# Patient Record
Sex: Female | Born: 1995 | Race: White | Hispanic: No | Marital: Single | Smoking: Never smoker
Health system: Southern US, Community
[De-identification: ages and names within clinical notes are randomized; demographics above are authoritative.]

## PROBLEM LIST (undated history)

## (undated) DIAGNOSIS — J45909 Unspecified asthma, uncomplicated: Secondary | ICD-10-CM

## (undated) DIAGNOSIS — C8190 Hodgkin lymphoma, unspecified, unspecified site: Secondary | ICD-10-CM

---
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
## 2019-10-29 ENCOUNTER — Telehealth: Payer: Self-pay | Admitting: Hematology

## 2019-10-29 NOTE — Telephone Encounter (Signed)
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 Ms. Pitstick has been scheduled to see Dr. Irene Limbo on 8/24 at 11am. Aware to arrive 15 minutes early. Pt's mom will also fax additional records to our office.

## 2019-11-09 ENCOUNTER — Ambulatory Visit: Payer: Self-pay | Admitting: Hematology

## 2019-11-10 ENCOUNTER — Inpatient Hospital Stay: Payer: 59 | Attending: Hematology | Admitting: Hematology

## 2019-11-10 ENCOUNTER — Telehealth: Payer: Self-pay | Admitting: *Deleted

## 2019-11-10 ENCOUNTER — Other Ambulatory Visit: Payer: Self-pay

## 2019-11-10 VITALS — BP 122/80 | HR 76 | Temp 98.6°F | Resp 18 | Ht 74.0 in | Wt 150.3 lb

## 2019-11-10 DIAGNOSIS — D638 Anemia in other chronic diseases classified elsewhere: Secondary | ICD-10-CM | POA: Diagnosis not present

## 2019-11-10 DIAGNOSIS — C8118 Nodular sclerosis classical Hodgkin lymphoma, lymph nodes of multiple sites: Secondary | ICD-10-CM | POA: Diagnosis not present

## 2019-11-10 NOTE — Progress Notes (Signed)
HEMATOLOGY/ONCOLOGY CONSULTATION NOTE  Date of Service: 11/10/2019  Patient Care Team: System, Pcp Not In as PCP - General  CHIEF COMPLAINTS/PURPOSE OF CONSULTATION:  Nodular sclerosis classical Hodgkin lymphoma  HISTORY OF PRESENTING ILLNESS:   Sonia Doyle is a wonderful 24 y.o. female who has been referred to [place] by Dr. Fonda Kinder for evaluation and management of newly diagnosed nodular sclerosis CHL. Pt is accompanied today by her mother. The pt reports that she is doing well overall.   The pt reports that she has asthma and has had a wisdom tooth extraction and tonsillectomy. She is only using her inhaler as needed at this time. She has no other known medical issues or medication allergies. 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 She has a family history significant for skin cancers.   Pt noticed an enlarged right-sided supraclavicular lymph node, which lead to the work up. She has since noticed more enlarged lymph nodes, but also a reduction in the size of the lymph node that initially concerned her. She had a fever in June that was accompanied by chills and night sweats. Pt denies weight loss, but has had multiple itchy rashes.   Pt has not had a port placed yet, as she had to cancel her previous appointment. Pt is scheduled for a consultation at [facility] later today. She has been set up for ovum harvesting in about 7-10 days for fertility preservation. She is currently on Menopur.   Prior to her referral pt received one IV Iron test dose, which caused her to be hoarse. They did not attempt any further IV Iron infusions. She was told to continue PO Iron.   08/24/2019 US Soft Tissue revealed "Biliteral supraclavicular nodules with atypical featues for lymph nodes, consider further evaluation with enhanced CT of the chest, abdomen and pelvis for the presence of additional adenopathy".   09/03/2019 CT Chest  revealed "Anterior mediastinal, bilateral hilar and neck adenopathy most suggestive of Lymphoma".   10/09/2019 Right Cervical Lymph Node Biopsy revealed "Classic Hodgkin's lymphoma, nodular sclerosis type".   10/09/2019 Flow Cytometry Report revealed "Negative for B-cell lymphoproliferative disorder or aberrant T-cell immunophenotype"  10/27/2019 ECHO was normal  11/03/2019 Left Iliac Bone BM Bx revealed  "-Normocellular to slightly hypercellular marrow with trilineage hematopoiesis and adequate magakaryocytes - Negative for lymphoma or other form of malignancy".  11/05/2019 PET/CT revealed  "Hypermetabolic lymphadeopathy involving multiple nodal stations in the neck and chest, consistent with reported history of lymphoma."   Most recent lab results (10/20/2019) of CBC w/diff & CMP is as follows: all values are WNL except for WBC at 14.8K, PLT at 588K, MPV at 6.8, Neutro Abs at 12.08K, Glucose at 68, Albumin at 3.0, ALP at 147. 10/20/2019 LDH at 279 09/15/2019 Sed Rate at 73  On review of systems, pt reports fevers, chills, night sweats, rash, new lumps/bumps and denies unexpected weight loss, constipation, abdominal pain and any other symptoms.   On PMHx the pt reports Asthma, Tonsillectomy, Wisdom Tooth Extraction. On Social Hx the pt reports that she is a non-smoker and does not drink much alcohol outside of social situations.  On Family Hx the pt reports skin cancer.   MEDICAL HISTORY:  Childhood Asthma - not requiring preventative asthma medications/inhalers at present time   SURGICAL HISTORY: Tonsillectomy (2003) Wisdom Tooth Extraction    SOCIAL HISTORY: Social History   Socioeconomic History  . Marital status: Single  Spouse name: Not on file  . Number of children: Not on file  . Years of education: Not on file  . Highest education level: Not on file  Occupational History  . Not on file  Tobacco Use  . Smoking status: Not on file  Substance and Sexual Activity    . Alcohol use: Not on file  . Drug use: Not on file  . Sexual activity: Not on file  Other Topics Concern  . Not on file  Social History Narrative  . Not on file   Social Determinants of Health   Financial Resource Strain:   . Difficulty of Paying Living Expenses: Not on file  Food Insecurity:   . Worried About Charity fundraiser in the Last Year: Not on file  . Ran Out of Food in the Last Year: Not on file  Transportation Needs:   . Lack of Transportation (Medical): Not on file  . Lack of Transportation (Non-Medical): Not on file  Physical Activity:   . Days of Exercise per Week: Not on file  . Minutes of Exercise per Session: Not on file  Stress:   . Feeling of Stress : Not on file  Social Connections:   . Frequency of Communication with Friends and Family: Not on file  . Frequency of Social Gatherings with Friends and Family: Not on file  . Attends Religious Services: Not on file  . Active Member of Clubs or Organizations: Not on file  . Attends Archivist Meetings: Not on file  . Marital Status: Not on file  Intimate Partner Violence:   . Fear of Current or Ex-Partner: Not on file  . Emotionally Abused: Not on file  . Physically Abused: Not on file  . Sexually Abused: Not on file    FAMILY HISTORY: Hypothyroidism - Mother  Atrial fibrillation - Maternal Grandmother Diabetes type II - Paternal Grandmother  Dementia - Paternal Grandmother   ALLERGIES:  has No Known Allergies.  MEDICATIONS:  Current Outpatient Medications  Medication Sig Dispense Refill  . albuterol (VENTOLIN HFA) 108 (90 Base) MCG/ACT inhaler Inhale 90 puffs into the lungs as needed.    . ferrous sulfate 325 (65 FE) MG tablet Take 325 mg by mouth daily with breakfast.    . fluticasone (FLOVENT HFA) 110 MCG/ACT inhaler Inhale 110 puffs into the lungs as needed.    . Menotropins (MENOPUR) 75 units SOLR Inject 275 Units into the skin daily. Started 8/22 for 10 days    . TRI-ESTARYLLA  0.18/0.215/0.25 MG-35 MCG tablet Take 1 tablet by mouth daily.     No current facility-administered medications for this visit.    REVIEW OF SYSTEMS:    10 Point review of Systems was done is negative except as noted above.  PHYSICAL EXAMINATION: ECOG PERFORMANCE STATUS: 1 - Symptomatic but completely ambulatory  . Vitals:   11/10/19 1125  BP: 122/80  Pulse: 76  Resp: 18  Temp: 98.6 F (37 C)  SpO2: 100%   Filed Weights   11/10/19 1125  Weight: 150 lb 4.8 oz (68.2 kg)   .Body mass index is 19.3 kg/m.  GENERAL:alert, in no acute distress and comfortable SKIN: no acute rashes, no significant lesions EYES: conjunctiva are pink and non-injected, sclera anicteric OROPHARYNX: MMM, no exudates, no oropharyngeal erythema or ulceration NECK: supple, no JVD LYMPH:  no palpable lymphadenopathy in the inguinal region. B/L cervical and axillary lymphadenopathy. Many lymph nodes are barely palpable. LUNGS: clear to auscultation b/l with normal respiratory  effort HEART: regular rate & rhythm ABDOMEN:  normoactive bowel sounds , non tender, not distended. Extremity: no pedal edema PSYCH: alert & oriented x 3 with fluent speech NEURO: no focal motor/sensory deficits  LABORATORY DATA:  I have reviewed the data as listed  .No flowsheet data found.  .No flowsheet data found.   RADIOGRAPHIC STUDIES: I have personally reviewed the radiological images as listed and agreed with the findings in the report. No results found.  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 PLAN: -Discussed patient's most recent labs from 10/20/2019,  all values are WNL except for WBC at 14.8K, PLT at 588K, MPV at 6.8, Neutro Abs at 12.08K, Glucose at 68, Albumin at 3.0, ALP at 147. -Discussed 10/20/2019 LDH at 279 -Discussed 09/15/2019 Sed Rate at 71 -Discussed 10/09/2019 Right  Cervical Lymph Node Biopsy which revealed "Classic Hodgkin's lymphoma, nodular sclerosis type".  -Discussed 11/03/2019 Left Iliac Bone BM Bx which revealed  "-Normocellular to slightly hypercellular marrow with trilineage hematopoiesis and adequate magakaryocytes - Negative for lymphoma or other form of malignancy". -Discussed 11/05/2019 PET/CT which revealed  "Hypermetabolic lymphadeopathy involving multiple nodal stations in the neck and chest, consistent with reported history of lymphoma."  -Discussed staging for lymphomas. Advised pt that her disease would be considered Stage 2B - will treat to cure.  -Would recommend we treat pt with up to 6 cycles of ABVD, dropping Bleomycin after C2. Advised pt that this would be standard treatment.  -Advised pt that we would typically repeat PET/CT after C2 to assess effectiveness of treatment.  -Advised pt that Adriamycin can cause heart toxicities, Bleomycin can cause lung toxicities and a flagellate erythema, Vinblastine can cause neuropathy, hair loss, and nail discoloration, and Dacarbazine can cause cytopenias. ECHO was nml.  -Will get PFT for baseline.   -Advised pt that she is not anemic at this time. Not a strong indication to begin IV Iron - will use as needed during treatment.  -Advised pt continue PO Iron. Recommend 150 mg Iron Polysaccharide.  -Discussed using Lupron during treatment to preserve fertility. Recommend pt discuss with her fertility team.  -Discussed the CDC guidelines regarding the COVID19 vaccine booster. Recommend pt discuss this with her fertility team. -Recommended that the pt continue to eat well, drink at least 48-64 oz of water each day, and walk 20-30 minutes each day.  -Recommend pt f/u with fertility team for ovum harvesting as scheduled.  -Recommend pt f/u at [facility] as scheduled  -Will get Port-a-Cath placement and PFT in 2-3 days -Will set up chemo-counseling ASAP -Will begin ABVD in 16 days  -Will see back  with C1D1 ABVD   FOLLOW UP: -Chemo-counseling for ABVD ASAP -port a cath placement ASAP in 2-3 days -Pulmonary function testing in 2-3 days -Plz schedule to start ABVD with portflush, labs and MD visit on 11/26/2019   All of the patients questions were answered with apparent satisfaction. The patient knows to call the clinic with any problems, questions or concerns.  I spent 60 mins counseling the patient face to face. The total time spent in the appointment was 80 minutes and more than 50% was on counseling and direct patient cares.    Sullivan Lone MD [place] AAHIVMS [facility] Hematology/Oncology Physician [facility]  (Office):       [phone] (Work cell):  [phone] (Fax):  [phone]  11/10/2019 5:07 PM  I, Yevette Edwards, am acting as a scribe for Dr. Sullivan Lone.   .I have reviewed the above documentation for accuracy and completeness, and I agree with the above. Brunetta Genera MD

## 2019-11-10 NOTE — Telephone Encounter (Signed)
Called Tedra Coupe to request PET/CT images be sent over to disc from [facility]. Per Tedra Coupe, "not sure if they have powershare to push scans over but will request to send PET/CT scan disc."

## 2019-11-16 ENCOUNTER — Other Ambulatory Visit (HOSPITAL_COMMUNITY)
Admission: RE | Admit: 2019-11-16 | Discharge: 2019-11-16 | Disposition: A | Discharge status: Home / Self Care | Payer: 59 | Source: Ambulatory Visit | Attending: Hematology | Admitting: Hematology

## 2019-11-16 DIAGNOSIS — Z20822 Contact with and (suspected) exposure to covid-19: Secondary | ICD-10-CM | POA: Insufficient documentation

## 2019-11-16 DIAGNOSIS — Z01812 Encounter for preprocedural laboratory examination: Secondary | ICD-10-CM | POA: Diagnosis not present

## 2019-11-16 LAB — SARS CORONAVIRUS 2 (TAT 6-24 HRS): SARS Coronavirus 2: NEGATIVE

## 2019-11-17 ENCOUNTER — Other Ambulatory Visit: Payer: Self-pay | Admitting: Radiology

## 2019-11-18 ENCOUNTER — Inpatient Hospital Stay: Payer: 59 | Attending: Hematology

## 2019-11-18 ENCOUNTER — Other Ambulatory Visit: Payer: Self-pay

## 2019-11-18 DIAGNOSIS — Z5189 Encounter for other specified aftercare: Secondary | ICD-10-CM | POA: Insufficient documentation

## 2019-11-18 DIAGNOSIS — D630 Anemia in neoplastic disease: Secondary | ICD-10-CM | POA: Insufficient documentation

## 2019-11-18 DIAGNOSIS — Z5111 Encounter for antineoplastic chemotherapy: Secondary | ICD-10-CM | POA: Insufficient documentation

## 2019-11-18 DIAGNOSIS — C8111 Nodular sclerosis classical Hodgkin lymphoma, lymph nodes of head, face, and neck: Secondary | ICD-10-CM | POA: Insufficient documentation

## 2019-11-19 ENCOUNTER — Ambulatory Visit (HOSPITAL_COMMUNITY)
Admission: RE | Admit: 2019-11-19 | Discharge: 2019-11-19 | Disposition: A | Discharge status: Home / Self Care | Payer: 59 | Source: Ambulatory Visit | Attending: Family | Admitting: Family

## 2019-11-19 ENCOUNTER — Encounter (HOSPITAL_COMMUNITY): Payer: Self-pay

## 2019-11-19 ENCOUNTER — Other Ambulatory Visit: Payer: Self-pay

## 2019-11-19 ENCOUNTER — Ambulatory Visit (HOSPITAL_COMMUNITY)
Admission: RE | Admit: 2019-11-19 | Discharge: 2019-11-19 | Disposition: A | Discharge status: Home / Self Care | Payer: 59 | Source: Ambulatory Visit | Attending: Hematology | Admitting: Hematology

## 2019-11-19 DIAGNOSIS — Z7951 Long term (current) use of inhaled steroids: Secondary | ICD-10-CM | POA: Diagnosis not present

## 2019-11-19 DIAGNOSIS — D638 Anemia in other chronic diseases classified elsewhere: Secondary | ICD-10-CM | POA: Diagnosis not present

## 2019-11-19 DIAGNOSIS — Z79899 Other long term (current) drug therapy: Secondary | ICD-10-CM | POA: Diagnosis not present

## 2019-11-19 DIAGNOSIS — C8101 Nodular lymphocyte predominant Hodgkin lymphoma, lymph nodes of head, face, and neck: Secondary | ICD-10-CM | POA: Insufficient documentation

## 2019-11-19 DIAGNOSIS — J45909 Unspecified asthma, uncomplicated: Secondary | ICD-10-CM | POA: Diagnosis not present

## 2019-11-19 DIAGNOSIS — C8118 Nodular sclerosis classical Hodgkin lymphoma, lymph nodes of multiple sites: Secondary | ICD-10-CM

## 2019-11-19 HISTORY — PX: IR IMAGING GUIDED PORT INSERTION: IMG5740

## 2019-11-19 LAB — CBC WITH DIFFERENTIAL/PLATELET
Abs Immature Granulocytes: 0.05 10*3/uL (ref 0.00–0.07)
Basophils Absolute: 0.1 10*3/uL (ref 0.0–0.1)
Basophils Relative: 1 %
Eosinophils Absolute: 0.1 10*3/uL (ref 0.0–0.5)
Eosinophils Relative: 1 %
HCT: 41.1 % (ref 36.0–46.0)
Hemoglobin: 13.2 g/dL (ref 12.0–15.0)
Immature Granulocytes: 0 %
Lymphocytes Relative: 18 %
Lymphs Abs: 3 10*3/uL (ref 0.7–4.0)
MCH: 27.8 pg (ref 26.0–34.0)
MCHC: 32.1 g/dL (ref 30.0–36.0)
MCV: 86.5 fL (ref 80.0–100.0)
Monocytes Absolute: 1 10*3/uL (ref 0.1–1.0)
Monocytes Relative: 6 %
Neutro Abs: 12 10*3/uL — ABNORMAL HIGH (ref 1.7–7.7)
Neutrophils Relative %: 74 %
Platelets: 500 10*3/uL — ABNORMAL HIGH (ref 150–400)
RBC: 4.75 MIL/uL (ref 3.87–5.11)
RDW: 14.9 % (ref 11.5–15.5)
WBC: 16.3 10*3/uL — ABNORMAL HIGH (ref 4.0–10.5)
nRBC: 0 % (ref 0.0–0.2)

## 2019-11-19 LAB — BASIC METABOLIC PANEL
Anion gap: 10 (ref 5–15)
BUN: 10 mg/dL (ref 6–20)
CO2: 25 mmol/L (ref 22–32)
Calcium: 9.5 mg/dL (ref 8.9–10.3)
Chloride: 104 mmol/L (ref 98–111)
Creatinine, Ser: 0.56 mg/dL (ref 0.44–1.00)
GFR calc Af Amer: >60 mL/min (ref >60)
GFR calc non Af Amer: >60 mL/min (ref >60)
Glucose, Bld: 86 mg/dL (ref 70–99)
Potassium: 3.8 mmol/L (ref 3.5–5.1)
Sodium: 139 mmol/L (ref 135–145)

## 2019-11-19 MED ORDER — HEPARIN SOD (PORK) LOCK FLUSH 100 UNIT/ML IV SOLN
INTRAVENOUS | Status: AC
  Filled 2019-11-19: qty 5

## 2019-11-19 MED ORDER — FENTANYL CITRATE (PF) 100 MCG/2ML IJ SOLN
INTRAMUSCULAR | Status: DC | PRN
  Administered 2019-11-19 (×2): 50 ug via INTRAVENOUS

## 2019-11-19 MED ORDER — CEFAZOLIN SODIUM-DEXTROSE 2-4 GM/100ML-% IV SOLN
INTRAVENOUS | Status: AC
  Filled 2019-11-19: qty 100

## 2019-11-19 MED ORDER — MIDAZOLAM HCL 2 MG/2ML IJ SOLN
INTRAMUSCULAR | Status: DC | PRN
  Administered 2019-11-19 (×2): 0.5 mg via INTRAVENOUS
  Administered 2019-11-19 (×2): 1 mg via INTRAVENOUS

## 2019-11-19 MED ORDER — LIDOCAINE HCL 1 % IJ SOLN
INTRAMUSCULAR | Status: AC
  Administered 2019-11-19: 10 mL
  Filled 2019-11-19: qty 20

## 2019-11-19 MED ORDER — ALBUTEROL SULFATE HFA 108 (90 BASE) MCG/ACT IN AERS: 1.0000 | INHALATION_SPRAY | RESPIRATORY_TRACT | 0 refills | Status: AC | PRN

## 2019-11-19 MED ORDER — MIDAZOLAM HCL 2 MG/2ML IJ SOLN
INTRAMUSCULAR | Status: AC
  Filled 2019-11-19: qty 2

## 2019-11-19 MED ORDER — FLUTICASONE PROPIONATE HFA 110 MCG/ACT IN AERO: 1.0000 | INHALATION_SPRAY | RESPIRATORY_TRACT | 0 refills | Status: AC | PRN

## 2019-11-19 MED ORDER — SODIUM CHLORIDE 0.9 % IV SOLN: INTRAVENOUS | Status: DC

## 2019-11-19 MED ORDER — CEFAZOLIN SODIUM-DEXTROSE 2-4 GM/100ML-% IV SOLN
2.0000 g | INTRAVENOUS | Status: AC
  Administered 2019-11-19: 2 g via INTRAVENOUS

## 2019-11-19 MED ORDER — FENTANYL CITRATE (PF) 100 MCG/2ML IJ SOLN
INTRAMUSCULAR | Status: AC
Start: 2019-11-19 — End: 2019-11-19
  Filled 2019-11-19: qty 2

## 2019-11-19 NOTE — Discharge Instructions (Signed)

## 2019-11-19 NOTE — Procedures (Signed)
Interventional Radiology Procedure Note  Procedure: Single Lumen Power Port Placement    Access:  Right IJ vein.  Findings: Catheter tip positioned at SVC/RA junction. Port is ready for immediate use.   Complications: None  EBL: < 10 mL  Recommendations:  - Ok to shower in 24 hours - Do not submerge for 7 days - Routine line care   Osmani Kersten T. Nicholaus Steinke, M.D Pager:  [phone]   

## 2019-11-19 NOTE — H&P (Signed)
Chief Complaint: Patient was seen in consultation today for Hodgkin's lymphoma/Port-a-cath placement.  Referring Physician(s): Brunetta Genera  Supervising Physician: Aletta Edouard  Patient Status: [facility] - Out-pt  History of Present Illness: Sonia Doyle is a 24 y.o. female with a past medical history of asthma, Hodgkin's lymphoma, and anemia of chronic disease. She was unfortunately diagnosed with Hodgkin's lymphoma in 10/2019. Her cancer is managed by Dr. Irene Limbo. She has tentative plans to begin systemic chemotherapy as management.  IR requested by Dr. Irene Limbo for possible image-guided Port-a-cath placement. Patient awake and alert sitting in bed with no complaints at this time. Denies fever, chills, chest pain, dyspnea, abdominal pain, or headache.   History reviewed. No pertinent past medical history.  History reviewed. No pertinent surgical history.  Allergies: Patient has no known allergies.  Medications: Prior to Admission medications   Medication Sig Start Date End Date Taking? Authorizing Provider  ferrous sulfate 325 (65 FE) MG tablet Take 325 mg by mouth daily with breakfast.   Yes [provider]  Menotropins (MENOPUR) 75 units SOLR Inject 275 Units into the skin daily. Started 8/22 for 10 days   Yes [provider]  TRI-ESTARYLLA 0.18/0.215/0.25 MG-35 MCG tablet Take 1 tablet by mouth daily. 10/25/19  Yes [provider]  albuterol (VENTOLIN HFA) 108 (90 Base) MCG/ACT inhaler Inhale 90 puffs into the lungs as needed. 06/13/16   [provider]  fluticasone (FLOVENT HFA) 110 MCG/ACT inhaler Inhale 110 puffs into the lungs as needed. 06/05/18   [provider]     History reviewed. No pertinent family history.  Social History   Socioeconomic History  . Marital status: Single    Spouse name: Not on file  . Number of children: Not on file  . Years of education: Not on file  . Highest education level: Not on file    Occupational History  . Not on file  Tobacco Use  . Smoking status: Never Smoker  . Smokeless tobacco: Never Used  Substance and Sexual Activity  . Alcohol use: Not on file  . Drug use: Not on file  . Sexual activity: Not on file  Other Topics Concern  . Not on file  Social History Narrative  . Not on file   Social Determinants of Health   Financial Resource Strain:   . Difficulty of Paying Living Expenses: Not on file  Food Insecurity:   . Worried About Charity fundraiser in the Last Year: Not on file  . Ran Out of Food in the Last Year: Not on file  Transportation Needs:   . Lack of Transportation (Medical): Not on file  . Lack of Transportation (Non-Medical): Not on file  Physical Activity:   . Days of Exercise per Week: Not on file  . Minutes of Exercise per Session: Not on file  Stress:   . Feeling of Stress : Not on file  Social Connections:   . Frequency of Communication with Friends and Family: Not on file  . Frequency of Social Gatherings with Friends and Family: Not on file  . Attends Religious Services: Not on file  . Active Member of Clubs or Organizations: Not on file  . Attends Archivist Meetings: Not on file  . Marital Status: Not on file     Review of Systems: A 12 point ROS discussed and pertinent positives are indicated in the HPI above.  All other systems are negative.  Review of Systems  Constitutional: Negative for chills  and fever.  Respiratory: Negative for shortness of breath and wheezing.   Cardiovascular: Negative for chest pain and palpitations.  Gastrointestinal: Negative for abdominal pain.  Neurological: Negative for headaches.  Psychiatric/Behavioral: Negative for behavioral problems and confusion.    Vital Signs: Pulse 86   Temp 98.3 F (36.8 C) (Oral)   Resp 16   LMP 11/05/2019   Physical Exam Vitals and nursing note reviewed.  Constitutional:      General: She is not in acute distress.    Appearance: Normal  appearance.  Cardiovascular:     Rate and Rhythm: Normal rate and regular rhythm.     Heart sounds: Normal heart sounds. No murmur heard.   Pulmonary:     Effort: Pulmonary effort is normal. No respiratory distress.     Breath sounds: Normal breath sounds. No wheezing.  Skin:    General: Skin is warm and dry.  Neurological:     Mental Status: She is alert and oriented to person, place, and time.      MD Evaluation Airway: WNL Heart: WNL Abdomen: WNL Chest/ Lungs: WNL ASA  Classification: 3 Mallampati/Airway Score: One   Imaging: No results found.  Labs:  CBC: Recent Labs    11/19/19 1315  WBC 16.3*  HGB 13.2  HCT 41.1  PLT 500*    COAGS: No results for input(s): INR, APTT in the last 8760 hours.  BMP: Recent Labs    11/19/19 1315  NA 139  K 3.8  CL 104  CO2 25  GLUCOSE 86  BUN 10  CALCIUM 9.5  CREATININE 0.56  GFRNONAA >60  GFRAA >60     Assessment and Plan:  Hodgkin's lymphoma with tentative plans to begin systemic chemotherapy as management. Plan for image-guided Port-a-cath placement today in IR. Patient is NPO. Afebrile. She does not take blood thinners.  Risks and benefits of image-guided Port-a-catheter placement were discussed with the patient including, but not limited to bleeding, infection, pneumothorax, or fibrin sheath development and need for additional procedures. All of the patient's questions were answered, patient is agreeable to proceed. Consent signed and in chart.   Thank you for this interesting consult.  I greatly enjoyed meeting Sonia Doyle and look forward to participating in their care.  A copy of this report was sent to the requesting provider on this date.  Electronically Signed: Earley Abide, PA-C 11/19/2019, 2:05 PM   I spent a total of 30 Minutes in face to face in clinical consultation, greater than 50% of which was counseling/coordinating care for Hodgkin's lymphoma/Port-a-cath placement.

## 2019-11-20 ENCOUNTER — Ambulatory Visit (HOSPITAL_COMMUNITY)
Admission: RE | Admit: 2019-11-20 | Discharge: 2019-11-20 | Disposition: A | Discharge status: Home / Self Care | Payer: 59 | Source: Ambulatory Visit | Attending: Hematology | Admitting: Hematology

## 2019-11-20 DIAGNOSIS — C8118 Nodular sclerosis classical Hodgkin lymphoma, lymph nodes of multiple sites: Secondary | ICD-10-CM | POA: Insufficient documentation

## 2019-11-20 LAB — PULMONARY FUNCTION TEST
DL/VA % pred: 90 %
DL/VA: 4.09 ml/min/mmHg/L
DLCO cor % pred: 86 %
DLCO cor: 23.28 ml/min/mmHg
DLCO unc % pred: 85 %
DLCO unc: 23.13 ml/min/mmHg
FEF 25-75 Post: 4.36 L/sec
FEF 25-75 Pre: 3.61 L/sec
FEF2575-%Change-Post: 20 %
FEF2575-%Pred-Post: 108 %
FEF2575-%Pred-Pre: 89 %
FEV1-%Change-Post: 7 %
FEV1-%Pred-Post: 98 %
FEV1-%Pred-Pre: 91 %
FEV1-Post: 3.84 L
FEV1-Pre: 3.56 L
FEV1FVC-%Change-Post: 8 %
FEV1FVC-%Pred-Pre: 97 %
FEV6-%Change-Post: -1 %
FEV6-%Pred-Post: 92 %
FEV6-%Pred-Pre: 93 %
FEV6-Post: 4.21 L
FEV6-Pre: 4.26 L
FEV6FVC-%Pred-Post: 100 %
FEV6FVC-%Pred-Pre: 100 %
FVC-%Change-Post: -1 %
FVC-%Pred-Post: 92 %
FVC-%Pred-Pre: 93 %
FVC-Post: 4.21 L
FVC-Pre: 4.26 L
Post FEV1/FVC ratio: 91 %
Post FEV6/FVC ratio: 100 %
Pre FEV1/FVC ratio: 84 %
Pre FEV6/FVC Ratio: 100 %
RV % pred: 167 %
RV: 2.59 L
TLC % pred: 112 %
TLC: 6.69 L

## 2019-11-20 MED ORDER — ALBUTEROL SULFATE (2.5 MG/3ML) 0.083% IN NEBU
2.5000 mg | INHALATION_SOLUTION | Freq: Once | RESPIRATORY_TRACT | Status: AC
  Administered 2019-11-20: 2.5 mg via RESPIRATORY_TRACT

## 2019-11-24 ENCOUNTER — Other Ambulatory Visit: Payer: Self-pay | Admitting: Hematology

## 2019-11-24 DIAGNOSIS — C8118 Nodular sclerosis classical Hodgkin lymphoma, lymph nodes of multiple sites: Secondary | ICD-10-CM

## 2019-11-24 DIAGNOSIS — Z7189 Other specified counseling: Secondary | ICD-10-CM

## 2019-11-24 DIAGNOSIS — C8190 Hodgkin lymphoma, unspecified, unspecified site: Secondary | ICD-10-CM | POA: Insufficient documentation

## 2019-11-24 MED ORDER — DEXAMETHASONE 4 MG PO TABS: ORAL_TABLET | ORAL | 1 refills | Status: DC

## 2019-11-24 MED ORDER — PROCHLORPERAZINE MALEATE 10 MG PO TABS: 10.0000 mg | ORAL_TABLET | Freq: Four times a day (QID) | ORAL | 1 refills | Status: DC | PRN

## 2019-11-24 MED ORDER — LIDOCAINE-PRILOCAINE 2.5-2.5 % EX CREA: TOPICAL_CREAM | CUTANEOUS | 3 refills | Status: DC

## 2019-11-24 MED ORDER — LORAZEPAM 0.5 MG PO TABS: 0.5000 mg | ORAL_TABLET | Freq: Four times a day (QID) | ORAL | 0 refills | Status: DC | PRN

## 2019-11-24 MED ORDER — ONDANSETRON HCL 8 MG PO TABS: 8.0000 mg | ORAL_TABLET | Freq: Two times a day (BID) | ORAL | 1 refills | Status: DC | PRN

## 2019-11-24 NOTE — Progress Notes (Signed)
Pharmacist Chemotherapy Monitoring - Initial Assessment    Anticipated start date: 11/26/19  Regimen:  . Are orders appropriate based on the patient's diagnosis, regimen, and cycle? Yes . Does the plan date match the patient's scheduled date? Yes . Is the sequencing of drugs appropriate? Yes . Are the premedications appropriate for the patient's regimen? Yes . Prior Authorization for treatment is: Pending o If applicable, is the correct biosimilar selected based on the patient's insurance? not applicable  Organ Function and Labs: Marland Kitchen Are dose adjustments needed based on the patient's renal function, hepatic function, or hematologic function? No . Are appropriate labs ordered prior to the start of patient's treatment? Yes . Other organ system assessment, if indicated: anthracyclines: Echo/ MUGA . The following baseline labs, if indicated, have been ordered: N/A  Dose Assessment: . Are the drug doses appropriate? Yes . Are the following correct: o Drug concentrations Yes o IV fluid compatible with drug Yes o Administration routes Yes o Timing of therapy Yes . If applicable, does the patient have documented access for treatment and/or plans for port-a-cath placement? yes . If applicable, have lifetime cumulative doses been properly documented and assessed? yes Lifetime Dose Tracking  No doses have been documented on this patient for the following tracked chemicals: Doxorubicin, Epirubicin, Idarubicin, Daunorubicin, Mitoxantrone, Bleomycin, Oxaliplatin, Carboplatin, Liposomal Doxorubicin  o   Toxicity Monitoring/Prevention: . The patient has the following take home antiemetics prescribed: Ondansetron . The patient has the following take home medications prescribed: N/A . Medication allergies and previous infusion related reactions, if applicable, have been reviewed and addressed. Yes . The patient's current medication list has been assessed for drug-drug interactions with their  chemotherapy regimen. no significant drug-drug interactions were identified on review.  Order Review: . Are the treatment plan orders signed? Yes . Is the patient scheduled to see a provider prior to their treatment? Yes  I verify that I have reviewed each item in the above checklist and answered each question accordingly.  Philomena Course 11/24/2019 10:49 AM

## 2019-11-24 NOTE — Progress Notes (Signed)
START ON PATHWAY REGIMEN - Lymphoma and CLL     A cycle is every 28 days:     Bleomycin      Dacarbazine      Doxorubicin      Vinblastine   **Always confirm dose/schedule in your pharmacy ordering system**  Patient Characteristics: Classical Hodgkin Lymphoma, First Line, Stage I / II, Early Unfavorable with  Risk Factors Other  Than Bulky Mediastinal Disease, Age < 60 Disease Type: Not Applicable Disease Type: Not Applicable Disease Type: Classical Hodgkin Lymphoma Line of therapy: First Line First Line, Stage I/II Disease Characteristics: Early Unfavorable with Risk Factors Other Than Bulky Mediastinal Disease Age: < 60 Intent of Therapy: Curative Intent, Discussed with Patient 

## 2019-11-25 ENCOUNTER — Other Ambulatory Visit: Payer: Self-pay | Admitting: Hematology

## 2019-11-25 ENCOUNTER — Inpatient Hospital Stay: Payer: 59

## 2019-11-25 ENCOUNTER — Other Ambulatory Visit: Payer: Self-pay

## 2019-11-25 ENCOUNTER — Inpatient Hospital Stay (HOSPITAL_BASED_OUTPATIENT_CLINIC_OR_DEPARTMENT_OTHER): Payer: 59 | Admitting: Hematology

## 2019-11-25 VITALS — BP 119/70 | HR 76 | Temp 98.6°F | Resp 18 | Ht 74.0 in | Wt 152.9 lb

## 2019-11-25 DIAGNOSIS — C8118 Nodular sclerosis classical Hodgkin lymphoma, lymph nodes of multiple sites: Secondary | ICD-10-CM

## 2019-11-25 DIAGNOSIS — Z5111 Encounter for antineoplastic chemotherapy: Secondary | ICD-10-CM

## 2019-11-25 DIAGNOSIS — D638 Anemia in other chronic diseases classified elsewhere: Secondary | ICD-10-CM | POA: Diagnosis not present

## 2019-11-25 DIAGNOSIS — Z5189 Encounter for other specified aftercare: Secondary | ICD-10-CM | POA: Diagnosis not present

## 2019-11-25 DIAGNOSIS — Z95828 Presence of other vascular implants and grafts: Secondary | ICD-10-CM

## 2019-11-25 DIAGNOSIS — C8111 Nodular sclerosis classical Hodgkin lymphoma, lymph nodes of head, face, and neck: Secondary | ICD-10-CM | POA: Diagnosis present

## 2019-11-25 DIAGNOSIS — D630 Anemia in neoplastic disease: Secondary | ICD-10-CM | POA: Diagnosis not present

## 2019-11-25 LAB — CMP (CANCER CENTER ONLY)
ALT: <6 U/L (ref 0–44)
AST: 12 U/L — ABNORMAL LOW (ref 15–41)
Albumin: 3 g/dL — ABNORMAL LOW (ref 3.5–5.0)
Alkaline Phosphatase: 108 U/L (ref 38–126)
Anion gap: 5 (ref 5–15)
BUN: 8 mg/dL (ref 6–20)
CO2: 27 mmol/L (ref 22–32)
Calcium: 8.8 mg/dL — ABNORMAL LOW (ref 8.9–10.3)
Chloride: 105 mmol/L (ref 98–111)
Creatinine: 0.71 mg/dL (ref 0.44–1.00)
GFR, Est AFR Am: >60 mL/min (ref >60)
GFR, Estimated: >60 mL/min (ref >60)
Glucose, Bld: 92 mg/dL (ref 70–99)
Potassium: 4.2 mmol/L (ref 3.5–5.1)
Sodium: 137 mmol/L (ref 135–145)
Total Bilirubin: 0.3 mg/dL (ref 0.3–1.2)
Total Protein: 6.7 g/dL (ref 6.5–8.1)

## 2019-11-25 LAB — CBC WITH DIFFERENTIAL/PLATELET
Abs Immature Granulocytes: 0.13 10*3/uL — ABNORMAL HIGH (ref 0.00–0.07)
Basophils Absolute: 0.1 10*3/uL (ref 0.0–0.1)
Basophils Relative: 0 %
Eosinophils Absolute: 0.2 10*3/uL (ref 0.0–0.5)
Eosinophils Relative: 1 %
HCT: 31.5 % — ABNORMAL LOW (ref 36.0–46.0)
Hemoglobin: 10.3 g/dL — ABNORMAL LOW (ref 12.0–15.0)
Immature Granulocytes: 1 %
Lymphocytes Relative: 15 %
Lymphs Abs: 2.1 10*3/uL (ref 0.7–4.0)
MCH: 27.4 pg (ref 26.0–34.0)
MCHC: 32.7 g/dL (ref 30.0–36.0)
MCV: 83.8 fL (ref 80.0–100.0)
Monocytes Absolute: 1.1 10*3/uL — ABNORMAL HIGH (ref 0.1–1.0)
Monocytes Relative: 8 %
Neutro Abs: 10.8 10*3/uL — ABNORMAL HIGH (ref 1.7–7.7)
Neutrophils Relative %: 75 %
Platelets: 364 10*3/uL (ref 150–400)
RBC: 3.76 MIL/uL — ABNORMAL LOW (ref 3.87–5.11)
RDW: 14.6 % (ref 11.5–15.5)
WBC: 14.4 10*3/uL — ABNORMAL HIGH (ref 4.0–10.5)
nRBC: 0 % (ref 0.0–0.2)

## 2019-11-25 LAB — SEDIMENTATION RATE: Sed Rate: 30 mm/hr — ABNORMAL HIGH (ref 0–22)

## 2019-11-25 LAB — RETICULOCYTES
Immature Retic Fract: 13.7 % (ref 2.3–15.9)
RBC.: 3.77 MIL/uL — ABNORMAL LOW (ref 3.87–5.11)
Retic Count, Absolute: 75.4 10*3/uL (ref 19.0–186.0)
Retic Ct Pct: 2 % (ref 0.4–3.1)

## 2019-11-25 LAB — IRON AND TIBC
Iron: 18 ug/dL — ABNORMAL LOW (ref 41–142)
Saturation Ratios: 7 % — ABNORMAL LOW (ref 21–57)
TIBC: 278 ug/dL (ref 236–444)
UIBC: 259 ug/dL (ref 120–384)

## 2019-11-25 LAB — FERRITIN: Ferritin: 75 ng/mL (ref 11–307)

## 2019-11-25 MED ORDER — HEPARIN SOD (PORK) LOCK FLUSH 100 UNIT/ML IV SOLN
500.0000 [IU] | Freq: Once | INTRAVENOUS | Status: AC | PRN
  Administered 2019-11-25: 500 [IU]
  Filled 2019-11-25: qty 5

## 2019-11-25 MED ORDER — SODIUM CHLORIDE 0.9% FLUSH
10.0000 mL | INTRAVENOUS | Status: DC | PRN
  Administered 2019-11-25: 10 mL
  Filled 2019-11-25: qty 10

## 2019-11-25 NOTE — Progress Notes (Signed)
HEMATOLOGY/ONCOLOGY CONSULTATION NOTE  Date of Service: 11/25/2019  Patient Care Team: System, Pcp Not In as PCP - General  CHIEF COMPLAINTS/PURPOSE OF CONSULTATION:  Nodular sclerosis classical Hodgkin lymphoma  HISTORY OF PRESENTING ILLNESS:   Sonia Doyle is a wonderful 24 y.o. female who has been referred to [place] by Dr. Fonda Kinder for evaluation and management of newly diagnosed nodular sclerosis CHL. Pt is accompanied today by her mother. The pt reports that she is doing well overall.   The pt reports that she has asthma and has had a wisdom tooth extraction and tonsillectomy. She is only using her inhaler as needed at this time. She has no other known medical issues or medication allergies. 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 She has a family history significant for skin cancers.   Pt noticed an enlarged right-sided supraclavicular lymph node, which lead to the work up. She has since noticed more enlarged lymph nodes, but also a reduction in the size of the lymph node that initially concerned her. She had a fever in June that was accompanied by chills and night sweats. Pt denies weight loss, but has had multiple itchy rashes.   Pt has not had a port placed yet, as she had to cancel her previous appointment. Pt is scheduled for a consultation at [facility] later today. She has been set up for ovum harvesting in about 7-10 days for fertility preservation. She is currently on Menopur.   Prior to her referral pt received one IV Iron test dose, which caused her to be hoarse. They did not attempt any further IV Iron infusions. She was told to continue PO Iron.   08/24/2019 US Soft Tissue revealed "Biliteral supraclavicular nodules with atypical featues for lymph nodes, consider further evaluation with enhanced CT of the chest, abdomen and pelvis for the presence of additional adenopathy".   09/03/2019 CT Chest  revealed "Anterior mediastinal, bilateral hilar and neck adenopathy most suggestive of Lymphoma".   10/09/2019 Right Cervical Lymph Node Biopsy revealed "Classic Hodgkin's lymphoma, nodular sclerosis type".   10/09/2019 Flow Cytometry Report revealed "Negative for B-cell lymphoproliferative disorder or aberrant T-cell immunophenotype"  10/27/2019 ECHO was normal  11/03/2019 Left Iliac Bone BM Bx revealed  "-Normocellular to slightly hypercellular marrow with trilineage hematopoiesis and adequate magakaryocytes - Negative for lymphoma or other form of malignancy".  11/05/2019 PET/CT revealed  "Hypermetabolic lymphadeopathy involving multiple nodal stations in the neck and chest, consistent with reported history of lymphoma."   Most recent lab results (10/20/2019) of CBC w/diff & CMP is as follows: all values are WNL except for WBC at 14.8K, PLT at 588K, MPV at 6.8, Neutro Abs at 12.08K, Glucose at 68, Albumin at 3.0, ALP at 147. 10/20/2019 LDH at 279 09/15/2019 Sed Rate at 73  On review of systems, pt reports fevers, chills, night sweats, rash, new lumps/bumps and denies unexpected weight loss, constipation, abdominal pain and any other symptoms.   On PMHx the pt reports Asthma, Tonsillectomy, Wisdom Tooth Extraction. On Social Hx the pt reports that she is a non-smoker and does not drink much alcohol outside of social situations.  On Family Hx the pt reports skin cancer.   INTERVAL HISTORY:  Sonia Doyle is a wonderful 24 y.o. female who is here for evaluation and management of newly diagnosed nodular sclerosis CHL. The patient's last visit with [place] was on 11/10/2019. The pt reports that she is  doing well overall.  The pt reports that she had no issues with her Port-a-cath placement. She completed her egg harvesting this morning. She was given prophylactic Azithromycin from a procedure standpoint. Pt denies any significant blood loss from her egg harvesting. Her LNMP was two weeks ago. Pt  is currently taking OTC Ferrous Sulfate once daily. She has also taken Vitamin D a few times after it was found to be borderline low during her visit at [facility].   She denies any new symptoms, but has had some increased itching and enlarging lymph nodes in her left armpit.  Lab results today (11/25/19) of CBC w/diff and CMP is as follows: all values are WNL except for WBC at 14.4K, RBC at 3.76, Hgb at 10.3, HCT at 31.5, Neutro Abs at 10.8K, Mono Abs at 1.1K, Abs Immature Granulocytes at 0.13K, Calcium at 8.8, Albumin at 3.0, AST at 12. 11/25/2019 Reticulocytes is as follows: Retic Ct Pct at 2.0, RBC at 3.77, Retic Ct Abs at 75.4, Immature Retic Fract at 13.7 11/25/2019 Iron Panel is as follows: Iron at 18, TIBC a 278, Sat Ratios at 7, UIBC at 259 11/25/2019 Ferritin at 75 11/25/2019 Sed Rate at 30  On review of systems, pt reports increased skin itching, enlarging bumps and denies fevers, chills, unexpected blood loss and any other symptoms.   MEDICAL HISTORY:  Childhood Asthma - not requiring preventative asthma medications/inhalers at present time   SURGICAL HISTORY: Tonsillectomy (2003) Wisdom Tooth Extraction    SOCIAL HISTORY: Social History   Socioeconomic History  . Marital status: Single    Spouse name: Not on file  . Number of children: Not on file  . Years of education: Not on file  . Highest education level: Not on file  Occupational History  . Not on file  Tobacco Use  . Smoking status: Never Smoker  . Smokeless tobacco: Never Used  Substance and Sexual Activity  . Alcohol use: Not on file  . Drug use: Not on file  . Sexual activity: Not on file  Other Topics Concern  . Not on file  Social History Narrative  . Not on file   Social Determinants of Health   Financial Resource Strain:   . Difficulty of Paying Living Expenses: Not on file  Food Insecurity:   . Worried About Charity fundraiser in the Last Year: Not on file  . Ran Out of Food in the Last Year: Not  on file  Transportation Needs:   . Lack of Transportation (Medical): Not on file  . Lack of Transportation (Non-Medical): Not on file  Physical Activity:   . Days of Exercise per Week: Not on file  . Minutes of Exercise per Session: Not on file  Stress:   . Feeling of Stress : Not on file  Social Connections:   . Frequency of Communication with Friends and Family: Not on file  . Frequency of Social Gatherings with Friends and Family: Not on file  . Attends Religious Services: Not on file  . Active Member of Clubs or Organizations: Not on file  . Attends Archivist Meetings: Not on file  . Marital Status: Not on file  Intimate Partner Violence:   . Fear of Current or Ex-Partner: Not on file  . Emotionally Abused: Not on file  . Physically Abused: Not on file  . Sexually Abused: Not on file    FAMILY HISTORY: Hypothyroidism - Mother  Atrial fibrillation - Maternal Grandmother Diabetes type II -  Paternal Grandmother  Dementia - Paternal Grandmother   ALLERGIES:  has No Known Allergies.  MEDICATIONS:  Current Outpatient Medications  Medication Sig Dispense Refill  . albuterol (VENTOLIN HFA) 108 (90 Base) MCG/ACT inhaler Inhale 1 puff into the lungs as needed for wheezing or shortness of breath. 1 each 0  . dexamethasone (DECADRON) 4 MG tablet Take 2 tablets by mouth once a day for 3 days after chemotherapy. Take with food. 30 tablet 1  . ferrous sulfate 325 (65 FE) MG tablet Take 325 mg by mouth daily with breakfast.    . fluticasone (FLOVENT HFA) 110 MCG/ACT inhaler Inhale 1 puff into the lungs as needed. 1 each 0  . lidocaine-prilocaine (EMLA) cream Apply to affected area once 30 g 3  . LORazepam (ATIVAN) 0.5 MG tablet Take 1 tablet (0.5 mg total) by mouth every 6 (six) hours as needed (Nausea or vomiting). 30 tablet 0  . Menotropins (MENOPUR) 75 units SOLR Inject 275 Units into the skin daily. Started 8/22 for 10 days    . ondansetron (ZOFRAN) 8 MG tablet Take 1  tablet (8 mg total) by mouth 2 (two) times daily as needed. Start on the third day after chemotherapy. 30 tablet 1  . prochlorperazine (COMPAZINE) 10 MG tablet Take 1 tablet (10 mg total) by mouth every 6 (six) hours as needed (Nausea or vomiting). 30 tablet 1  . TRI-ESTARYLLA 0.18/0.215/0.25 MG-35 MCG tablet Take 1 tablet by mouth daily.     No current facility-administered medications for this visit.    REVIEW OF SYSTEMS:   A 10+ POINT REVIEW OF SYSTEMS WAS OBTAINED including neurology, dermatology, psychiatry, cardiac, respiratory, lymph, extremities, GI, GU, Musculoskeletal, constitutional, breasts, reproductive, HEENT.  All pertinent positives are noted in the HPI.  All others are negative.   PHYSICAL EXAMINATION: ECOG PERFORMANCE STATUS: 1 - Symptomatic but completely ambulatory  . Vitals:   11/25/19 1443  BP: 119/70  Pulse: 76  Resp: 18  Temp: 98.6 F (37 C)  SpO2: 100%   Filed Weights   11/25/19 1443  Weight: 152 lb 14.4 oz (69.4 kg)   .Body mass index is 19.63 kg/m.  GENERAL:alert, in no acute distress and comfortable SKIN: no acute rashes, no significant lesions EYES: conjunctiva are pink and non-injected, sclera anicteric OROPHARYNX: MMM, no exudates, no oropharyngeal erythema or ulceration NECK: supple, no JVD LYMPH: no palpable lymphadenopathy in the inguinal region. B/L cervical and axillary lymphadenopathy. Many lymph nodes are barely palpable. Cervical lymph nodes may be slightly enlarged. LUNGS: clear to auscultation b/l with normal respiratory effort HEART: regular rate & rhythm ABDOMEN:  normoactive bowel sounds , non tender, not distended. No palpable hepatosplenomegaly.  Extremity: no pedal edema PSYCH: alert & oriented x 3 with fluent speech NEURO: no focal motor/sensory deficits  LABORATORY DATA:  I have reviewed the data as listed  . CBC Latest Ref Rng & Units 11/25/2019 11/19/2019  WBC 4.0 - 10.5 K/uL 14.4(H) 16.3(H)  Hemoglobin 12.0 - 15.0 g/dL  10.3(L) 13.2  Hematocrit 36 - 46 % 31.5(L) 41.1  Platelets 150 - 400 K/uL 364 500(H)    . CMP Latest Ref Rng & Units 11/25/2019 11/19/2019  Glucose 70 - 99 mg/dL 92 86  BUN 6 - 20 mg/dL 8 10  Creatinine 0.44 - 1.00 mg/dL 0.71 0.56  Sodium 135 - 145 mmol/L 137 139  Potassium 3.5 - 5.1 mmol/L 4.2 3.8  Chloride 98 - 111 mmol/L 105 104  CO2 22 - 32 mmol/L 27 25  Calcium  8.9 - 10.3 mg/dL 8.8(L) 9.5  Total Protein 6.5 - 8.1 g/dL 6.7 -  Total Bilirubin 0.3 - 1.2 mg/dL 0.3 -  Alkaline Phos 38 - 126 U/L 108 -  AST 15 - 41 U/L 12(L) -  ALT 0 - 44 U/L <6 -     RADIOGRAPHIC STUDIES: I have personally reviewed the radiological images as listed and agreed with the findings in the report. IR IMAGING GUIDED PORT INSERTION  Result Date: 11/19/2019 CLINICAL DATA:  Hodgkin's lymphoma and need for porta cath to begin chemotherapy. EXAM: IMPLANTED PORT A CATH PLACEMENT WITH ULTRASOUND AND FLUOROSCOPIC GUIDANCE ANESTHESIA/SEDATION: 3.0 mg IV Versed; 100 mcg IV Fentanyl Total Moderate Sedation Time:  31 minutes The patient's level of consciousness and physiologic status were continuously monitored during the procedure by Radiology nursing. Additional Medications: 2 g IV Ancef. FLUOROSCOPY TIME:  1 minute and 12 seconds.  5.0 mGy. PROCEDURE: The procedure, risks, benefits, and alternatives were explained to the patient. Questions regarding the procedure were encouraged and answered. The patient understands and consents to the procedure. A time-out was performed prior to initiating the procedure. Ultrasound was utilized to confirm patency of the right internal jugular vein. The right neck and chest were prepped with chlorhexidine in a sterile fashion, and a sterile drape was applied covering the operative field. Maximum barrier sterile technique with sterile gowns and gloves were used for the procedure. Local anesthesia was provided with 1% lidocaine. After creating a small venotomy incision, a 21 gauge needle was  advanced into the right internal jugular vein under direct, real-time ultrasound guidance. Ultrasound image documentation was performed. After securing guidewire access, an 8 Fr dilator was placed. A J-wire was kinked to measure appropriate catheter length. A subcutaneous port pocket was then created along the upper chest wall utilizing sharp and blunt dissection. Portable cautery was utilized. The pocket was irrigated with sterile saline. A single lumen power injectable port was chosen for placement. The 8 Fr catheter was tunneled from the port pocket site to the venotomy incision. The port was placed in the pocket. External catheter was trimmed to appropriate length based on guidewire measurement. At the venotomy, an 8 Fr peel-away sheath was placed over a guidewire. The catheter was then placed through the sheath and the sheath removed. Final catheter positioning was confirmed and documented with a fluoroscopic spot image. The port was accessed with a needle and aspirated and flushed with heparinized saline. The access needle was removed. The venotomy and port pocket incisions were closed with subcutaneous 3-0 Monocryl and subcuticular 4-0 Vicryl. Dermabond was applied to both incisions. COMPLICATIONS: COMPLICATIONS None FINDINGS: After catheter placement, the tip lies at the cavo-atrial junction. The catheter aspirates normally and is ready for immediate use. IMPRESSION: Placement of single lumen port a cath via right internal jugular vein. The catheter tip lies at the cavo-atrial junction. A power injectable port a cath was placed and is ready for immediate use. Electronically Signed   By: Aletta Edouard M.D.   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  -11/05/2019 PET/CT  revealed  "Hypermetabolic lymphadeopathy involving multiple nodal stations in the neck and chest, consistent with reported history of lymphoma."  2) Anemia of chronic disease. PLAN: -Discussed pt labwork today, 11/25/19;  all values are WNL except for  WBC at 14.4K, RBC at 3.76, Hgb at 10.3, HCT at 31.5, Neutro Abs at 10.8K, Mono Abs at 1.1K, Abs Immature Granulocytes at 0.13K, Calcium at 8.8, Albumin at 3.0, AST at 12. -Discussed 11/25/2019 Reticulocytes is as follows: Retic Ct Pct at 2.0, RBC at 3.77, Retic Ct Abs at 75.4, Immature Retic Fract at 13.7 -Discussed 11/25/2019 Iron Panel is as follows: Iron at 18, TIBC a 278, Sat Ratios at 7, UIBC at 259 -Discussed 11/25/2019 Ferritin at 75 -Discussed 11/25/2019 Sed Rate at 30 -Discussed 11/20/2019 LFT showed some asthma, but no other concerns  -The pt has no prohibitive toxicities from beginning C1D1 ABVD at this time.  -Plan to drop Bleomycin and repeat PET/CT after C2.  -Advised pt that Hodgkin's Lymphoma, treatment, and Port-a-cath all increase the risk of blood clots. Advised pt that birth control also increases the risk of blood clots. Recommend pt use mechanical methods of contraception during treatment - pt agrees with this. -Advised pt that there will likely be hair loss, nail changes, and skin discoloration that is reversible.  -Recommend pt use salt/baking soda mouth rinses 3-4 times per day. -Discussed proper safety precautions. Advised pt that fever or diarrhea would delay treatment.  -Advised pt that preventing any treatment delays in the first three cycles is very important for health outcomes.  -Advised pt that she can receive the COVID19 5-6 days after her first treatment. -Recommend pt receive the annual flu vaccine when available. -Recommend OTC Vitamin E or Mederma gel for biopsy scar. -Recommend pt continue PO Iron and 2000-3000 UT Vitamin D daily -Will see back in 7-8 days with labs for a toxicity check     FOLLOW UP: -RTC  with Dr Irene Limbo with portflush and labs in 7-8 days for toxicity check and for covid booster vaccination. -Plz schedule C1D15 of treatment with portflush, labs and MD visit (will see in infusion) -Please schedule C2D1 and C2D15 each with portflush , labs and MD visit   The total time spent in the appt was 30 minutes and more than 50% was on counseling and direct patient cares.  All of the patient's questions were answered with apparent satisfaction. The patient knows to call the clinic with any problems, questions or concerns.    Sullivan Lone MD [place] AAHIVMS [facility] Hematology/Oncology Physician [facility]  (Office):       [phone] (Work cell):  [phone] (Fax):           [phone]  11/25/2019 4:33 PM   I, Yevette Edwards, am acting as a scribe for Dr. Sullivan Lone.   .I have reviewed the above documentation for accuracy and completeness, and I agree with the above. Brunetta Genera MD

## 2019-11-26 ENCOUNTER — Other Ambulatory Visit: Payer: Self-pay

## 2019-11-26 ENCOUNTER — Other Ambulatory Visit: Payer: 59

## 2019-11-26 ENCOUNTER — Other Ambulatory Visit: Payer: Self-pay | Admitting: Hematology

## 2019-11-26 ENCOUNTER — Inpatient Hospital Stay: Payer: 59

## 2019-11-26 VITALS — BP 125/72 | HR 92 | Temp 98.6°F | Resp 20

## 2019-11-26 DIAGNOSIS — C8118 Nodular sclerosis classical Hodgkin lymphoma, lymph nodes of multiple sites: Secondary | ICD-10-CM

## 2019-11-26 DIAGNOSIS — Z7189 Other specified counseling: Secondary | ICD-10-CM

## 2019-11-26 DIAGNOSIS — Z5111 Encounter for antineoplastic chemotherapy: Secondary | ICD-10-CM | POA: Diagnosis not present

## 2019-11-26 MED ORDER — SODIUM CHLORIDE 0.9% FLUSH
10.0000 mL | INTRAVENOUS | Status: DC | PRN
  Administered 2019-11-26: 10 mL
  Filled 2019-11-26: qty 10

## 2019-11-26 MED ORDER — SODIUM CHLORIDE 0.9 % IV SOLN
12.0000 mg | Freq: Once | INTRAVENOUS | Status: AC
  Administered 2019-11-26: 12 mg via INTRAVENOUS
  Filled 2019-11-26: qty 1.2

## 2019-11-26 MED ORDER — DOXORUBICIN HCL CHEMO IV INJECTION 2 MG/ML
25.0000 mg/m2 | Freq: Once | INTRAVENOUS | Status: AC
  Administered 2019-11-26: 48 mg via INTRAVENOUS
  Filled 2019-11-26: qty 24

## 2019-11-26 MED ORDER — LORAZEPAM 2 MG/ML IJ SOLN
0.5000 mg | Freq: Once | INTRAMUSCULAR | Status: AC
  Administered 2019-11-26: 0.5 mg via INTRAVENOUS

## 2019-11-26 MED ORDER — SODIUM CHLORIDE 0.9 % IV SOLN
150.0000 mg | Freq: Once | INTRAVENOUS | Status: AC
  Administered 2019-11-26: 150 mg via INTRAVENOUS
  Filled 2019-11-26: qty 150

## 2019-11-26 MED ORDER — SODIUM CHLORIDE 0.9 % IV SOLN
375.0000 mg/m2 | Freq: Once | INTRAVENOUS | Status: AC
  Administered 2019-11-26: 710 mg via INTRAVENOUS
  Filled 2019-11-26: qty 71

## 2019-11-26 MED ORDER — HEPARIN SOD (PORK) LOCK FLUSH 100 UNIT/ML IV SOLN
500.0000 [IU] | Freq: Once | INTRAVENOUS | Status: AC | PRN
  Administered 2019-11-26: 500 [IU]
  Filled 2019-11-26: qty 5

## 2019-11-26 MED ORDER — SODIUM CHLORIDE 0.9 % IV SOLN
16.0000 mg | Freq: Once | INTRAVENOUS | Status: AC
  Administered 2019-11-26: 16 mg via INTRAVENOUS
  Filled 2019-11-26: qty 8

## 2019-11-26 MED ORDER — VINBLASTINE SULFATE CHEMO INJECTION 1 MG/ML
5.8000 mg/m2 | Freq: Once | INTRAVENOUS | Status: AC
  Administered 2019-11-26: 11 mg via INTRAVENOUS
  Filled 2019-11-26: qty 11

## 2019-11-26 MED ORDER — SODIUM CHLORIDE 0.9 % IV SOLN
Freq: Once | INTRAVENOUS | Status: AC
  Filled 2019-11-26: qty 250

## 2019-11-26 MED ORDER — LORAZEPAM 2 MG/ML IJ SOLN
INTRAMUSCULAR | Status: AC
  Filled 2019-11-26: qty 1

## 2019-11-26 MED ORDER — SODIUM CHLORIDE 0.9 % IV SOLN
10.0000 [IU]/m2 | Freq: Once | INTRAVENOUS | Status: AC
  Administered 2019-11-26: 19 [IU] via INTRAVENOUS
  Filled 2019-11-26: qty 6.33

## 2019-11-26 NOTE — Patient Instructions (Signed)
[facility] Discharge Instructions for Patients Receiving Chemotherapy  Today you received the following chemotherapy agents Adriamycin, Bleomycin, Vinblastine, DTIC  To help prevent nausea and vomiting after your treatment, we encourage you to take your nausea medication as prescribed.   If you develop nausea and vomiting that is not controlled by your nausea medication, call the clinic.   BELOW ARE SYMPTOMS THAT SHOULD BE REPORTED IMMEDIATELY:  *FEVER GREATER THAN 100.5 F  *CHILLS WITH OR WITHOUT FEVER  NAUSEA AND VOMITING THAT IS NOT CONTROLLED WITH YOUR NAUSEA MEDICATION  *UNUSUAL SHORTNESS OF BREATH  *UNUSUAL BRUISING OR BLEEDING  TENDERNESS IN MOUTH AND THROAT WITH OR WITHOUT PRESENCE OF ULCERS  *URINARY PROBLEMS  *BOWEL PROBLEMS  UNUSUAL RASH Items with * indicate a potential emergency and should be followed up as soon as possible.  Feel free to call the clinic should you have any questions or concerns. The clinic phone number is [phone].  Please show the [facility] at check-in to the Emergency Department and triage nurse.  Doxorubicin injection What is this medicine? DOXORUBICIN (dox oh ROO bi sin) is a chemotherapy drug. It is used to treat many kinds of cancer like leukemia, lymphoma, neuroblastoma, sarcoma, and Wilms' tumor. It is also used to treat bladder cancer, breast cancer, lung cancer, ovarian cancer, stomach cancer, and thyroid cancer. This medicine may be used for other purposes; ask your health care provider or pharmacist if you have questions. COMMON BRAND NAME(S): Adriamycin, Adriamycin PFS, Adriamycin RDF, Rubex What should I tell my health care provider before I take this medicine? They need to know if you have any of these conditions:  heart disease  history of low blood counts caused by a medicine  liver disease  recent or ongoing radiation therapy  an unusual or allergic reaction to doxorubicin, other  chemotherapy agents, other medicines, foods, dyes, or preservatives  pregnant or trying to get pregnant  breast-feeding How should I use this medicine? This drug is given as an infusion into a vein. It is administered in a hospital or clinic by a specially trained health care professional. If you have pain, swelling, burning or any unusual feeling around the site of your injection, tell your health care professional right away. Talk to your pediatrician regarding the use of this medicine in children. Special care may be needed. Overdosage: If you think you have taken too much of this medicine contact a poison control center or emergency room at once. NOTE: This medicine is only for you. Do not share this medicine with others. What if I miss a dose? It is important not to miss your dose. Call your doctor or health care professional if you are unable to keep an appointment. What may interact with this medicine? This medicine may interact with the following medications:  6-mercaptopurine  paclitaxel  phenytoin  St. John's Wort  trastuzumab  verapamil This list may not describe all possible interactions. Give your health care provider a list of all the medicines, herbs, non-prescription drugs, or dietary supplements you use. Also tell them if you smoke, drink alcohol, or use illegal drugs. Some items may interact with your medicine. What should I watch for while using this medicine? This drug may make you feel generally unwell. This is not uncommon, as chemotherapy can affect healthy cells as well as cancer cells. Report any side effects. Continue your course of treatment even though you feel ill unless your doctor tells you to stop. There is a maximum  amount of this medicine you should receive throughout your life. The amount depends on the medical condition being treated and your overall health. Your doctor will watch how much of this medicine you receive in your lifetime. Tell your doctor  if you have taken this medicine before. You may need blood work done while you are taking this medicine. Your urine may turn red for a few days after your dose. This is not blood. If your urine is dark or brown, call your doctor. In some cases, you may be given additional medicines to help with side effects. Follow all directions for their use. Call your doctor or health care professional for advice if you get a fever, chills or sore throat, or other symptoms of a cold or flu. Do not treat yourself. This drug decreases your body's ability to fight infections. Try to avoid being around people who are sick. This medicine may increase your risk to bruise or bleed. Call your doctor or health care professional if you notice any unusual bleeding. Talk to your doctor about your risk of cancer. You may be more at risk for certain types of cancers if you take this medicine. Do not become pregnant while taking this medicine or for 6 months after stopping it. Women should inform their doctor if they wish to become pregnant or think they might be pregnant. Men should not father a child while taking this medicine and for 6 months after stopping it. There is a potential for serious side effects to an unborn child. Talk to your health care professional or pharmacist for more information. Do not breast-feed an infant while taking this medicine. This medicine has caused ovarian failure in some women and reduced sperm counts in some men This medicine may interfere with the ability to have a child. Talk with your doctor or health care professional if you are concerned about your fertility. This medicine may cause a decrease in Co-Enzyme Q-10. You should make sure that you get enough Co-Enzyme Q-10 while you are taking this medicine. Discuss the foods you eat and the vitamins you take with your health care professional. What side effects may I notice from receiving this medicine? Side effects that you should report to your  doctor or health care professional as soon as possible:  allergic reactions like skin rash, itching or hives, swelling of the face, lips, or tongue  breathing problems  chest pain  fast or irregular heartbeat  low blood counts - this medicine may decrease the number of white blood cells, red blood cells and platelets. You may be at increased risk for infections and bleeding.  pain, redness, or irritation at site where injected  signs of infection - fever or chills, cough, sore throat, pain or difficulty passing urine  signs of decreased platelets or bleeding - bruising, pinpoint red spots on the skin, black, tarry stools, blood in the urine  swelling of the ankles, feet, hands  tiredness  weakness Side effects that usually do not require medical attention (report to your doctor or health care professional if they continue or are bothersome):  diarrhea  hair loss  mouth sores  nail discoloration or damage  nausea  red colored urine  vomiting This list may not describe all possible side effects. Call your doctor for medical advice about side effects. You may report side effects to FDA at 1-800-FDA-1088. Where should I keep my medicine? This drug is given in a hospital or clinic and will not be stored at  home. NOTE: This sheet is a summary. It may not cover all possible information. If you have questions about this medicine, talk to your doctor, pharmacist, or health care provider.  2020 Elsevier/Gold Standard (2016-10-17 11:01:26)  Vinblastine injection What is this medicine? VINBLASTINE (vin BLAS teen) is a chemotherapy drug. It slows the growth of cancer cells. This medicine is used to treat many types of cancer like breast cancer, testicular cancer, Hodgkin's disease, non-Hodgkin's lymphoma, and sarcoma. This medicine may be used for other purposes; ask your health care provider or pharmacist if you have questions. COMMON BRAND NAME(S): Velban What should I tell my  health care provider before I take this medicine? They need to know if you have any of these conditions:  blood disorders  dental disease  gout  infection (especially a virus infection such as chickenpox, cold sores, or herpes)  liver disease  lung disease  nervous system disease  recent or ongoing radiation therapy  an unusual or allergic reaction to vinblastine, other chemotherapy agents, other medicines, foods, dyes, or preservatives  pregnant or trying to get pregnant  breast-feeding How should I use this medicine? This drug is given as an infusion into a vein. It is administered in a hospital or clinic by a specially trained health care professional. If you have pain, swelling, burning or any unusual feeling around the site of your injection, tell your health care professional right away. Talk to your pediatrician regarding the use of this medicine in children. While this drug may be prescribed for selected conditions, precautions do apply. Overdosage: If you think you have taken too much of this medicine contact a poison control center or emergency room at once. NOTE: This medicine is only for you. Do not share this medicine with others. What if I miss a dose? It is important not to miss your dose. Call your doctor or health care professional if you are unable to keep an appointment. What may interact with this medicine? Do not take this medicine with any of the following medications:  erythromycin  itraconazole  mibefradil  voriconazole This medicine may also interact with the following medications:  cyclosporine  fluconazole  ketoconazole  medicines for seizures like phenytoin  medicines to increase blood counts like filgrastim, pegfilgrastim, sargramostim  vaccines  verapamil Talk to your doctor or health care professional before taking any of these medicines:  acetaminophen  aspirin  ibuprofen  ketoprofen  naproxen This list may not  describe all possible interactions. Give your health care provider a list of all the medicines, herbs, non-prescription drugs, or dietary supplements you use. Also tell them if you smoke, drink alcohol, or use illegal drugs. Some items may interact with your medicine. What should I watch for while using this medicine? Your condition will be monitored carefully while you are receiving this medicine. You will need important blood work done while you are taking this medicine. This drug may make you feel generally unwell. This is not uncommon, as chemotherapy can affect healthy cells as well as cancer cells. Report any side effects. Continue your course of treatment even though you feel ill unless your doctor tells you to stop. In some cases, you may be given additional medicines to help with side effects. Follow all directions for their use. Call your doctor or health care professional for advice if you get a fever, chills or sore throat, or other symptoms of a cold or flu. Do not treat yourself. This drug decreases your body's ability to fight  infections. Try to avoid being around people who are sick. This medicine may increase your risk to bruise or bleed. Call your doctor or health care professional if you notice any unusual bleeding. Be careful brushing and flossing your teeth or using a toothpick because you may get an infection or bleed more easily. If you have any dental work done, tell your dentist you are receiving this medicine. Avoid taking products that contain aspirin, acetaminophen, ibuprofen, naproxen, or ketoprofen unless instructed by your doctor. These medicines may hide a fever. Do not become pregnant while taking this medicine. Women should inform their doctor if they wish to become pregnant or think they might be pregnant. There is a potential for serious side effects to an unborn child. Talk to your health care professional or pharmacist for more information. Do not breast-feed an infant  while taking this medicine. Men may have a lower sperm count while taking this medicine. Talk to your doctor if you plan to father a child. What side effects may I notice from receiving this medicine? Side effects that you should report to your doctor or health care professional as soon as possible:  allergic reactions like skin rash, itching or hives, swelling of the face, lips, or tongue  low blood counts - This drug may decrease the number of white blood cells, red blood cells and platelets. You may be at increased risk for infections and bleeding.  signs of infection - fever or chills, cough, sore throat, pain or difficulty passing urine  signs of decreased platelets or bleeding - bruising, pinpoint red spots on the skin, black, tarry stools, nosebleeds  signs of decreased red blood cells - unusually weak or tired, fainting spells, lightheadedness  breathing problems  changes in hearing  change in the amount of urine  chest pain  high blood pressure  mouth sores  nausea and vomiting  pain, swelling, redness or irritation at the injection site  pain, tingling, numbness in the hands or feet  problems with balance, dizziness  seizures Side effects that usually do not require medical attention (report to your doctor or health care professional if they continue or are bothersome):  constipation  hair loss  jaw pain  loss of appetite  sensitivity to light  stomach pain  tumor pain This list may not describe all possible side effects. Call your doctor for medical advice about side effects. You may report side effects to FDA at 1-800-FDA-1088. Where should I keep my medicine? This drug is given in a hospital or clinic and will not be stored at home. NOTE: This sheet is a summary. It may not cover all possible information. If you have questions about this medicine, talk to your doctor, pharmacist, or health care provider.  2020 Elsevier/Gold Standard (2007-12-01  17:15:59)  Bleomycin injection What is this medicine? BLEOMYCIN (blee oh MYE sin) is a chemotherapy drug. It is used to treat many kinds of cancer like lymphoma, cervical cancer, head and neck cancer, and testicular cancer. It is also used to prevent and to treat fluid build-up around the lungs caused by some cancers. This medicine may be used for other purposes; ask your health care provider or pharmacist if you have questions. COMMON BRAND NAME(S): Blenoxane What should I tell my health care provider before I take this medicine? They need to know if you have any of these conditions:  cigarette smoker  kidney disease  lung disease  recent or ongoing radiation therapy  an unusual or allergic reaction  to bleomycin, other chemotherapy agents, other medicines, foods, dyes, or preservatives  pregnant or trying to get pregnant  breast-feeding How should I use this medicine? This drug is given as an infusion into a vein or a body cavity. It can also be given as an injection into a muscle or under the skin. It is administered in a hospital or clinic by a specially trained health care professional. Talk to your pediatrician regarding the use of this medicine in children. Special care may be needed. Overdosage: If you think you have taken too much of this medicine contact a poison control center or emergency room at once. NOTE: This medicine is only for you. Do not share this medicine with others. What if I miss a dose? It is important not to miss your dose. Call your doctor or health care professional if you are unable to keep an appointment. What may interact with this medicine?  certain antibiotics given by injection  cisplatin  cyclosporine  diuretics  foscarnet  medicines to increase blood counts like filgrastim, pegfilgrastim, sargramostim  vaccines This list may not describe all possible interactions. Give your health care provider a list of all the medicines, herbs,  non-prescription drugs, or dietary supplements you use. Also tell them if you smoke, drink alcohol, or use illegal drugs. Some items may interact with your medicine. What should I watch for while using this medicine? Visit your doctor for checks on your progress. This drug may make you feel generally unwell. This is not uncommon, as chemotherapy can affect healthy cells as well as cancer cells. Report any side effects. Continue your course of treatment even though you feel ill unless your doctor tells you to stop. Call your doctor or health care professional for advice if you get a fever, chills or sore throat, or other symptoms of a cold or flu. Do not treat yourself. This drug decreases your body's ability to fight infections. Try to avoid being around people who are sick. Avoid taking products that contain aspirin, acetaminophen, ibuprofen, naproxen, or ketoprofen unless instructed by your doctor. These medicines may hide a fever. Do not become pregnant while taking this medicine. Women should inform their doctor if they wish to become pregnant or think they might be pregnant. There is a potential for serious side effects to an unborn child. Talk to your health care professional or pharmacist for more information. Do not breast-feed an infant while taking this medicine. There is a maximum amount of this medicine you should receive throughout your life. The amount depends on the medical condition being treated and your overall health. Your doctor will watch how much of this medicine you receive in your lifetime. Tell your doctor if you have taken this medicine before. What side effects may I notice from receiving this medicine? Side effects that you should report to your doctor or health care professional as soon as possible:  allergic reactions like skin rash, itching or hives, swelling of the face, lips, or tongue  breathing problems  chest pain  confusion  cough  fast, irregular  heartbeat  feeling faint or lightheaded, falls  fever or chills  mouth sores  pain, tingling, numbness in the hands or feet  trouble passing urine or change in the amount of urine  yellowing of the eyes or skin Side effects that usually do not require medical attention (report to your doctor or health care professional if they continue or are bothersome):  darker skin color  hair loss  irritation  at site where injected  loss of appetite  nail changes  nausea and vomiting  weight loss This list may not describe all possible side effects. Call your doctor for medical advice about side effects. You may report side effects to FDA at 1-800-FDA-1088. Where should I keep my medicine? This drug is given in a hospital or clinic and will not be stored at home. NOTE: This sheet is a summary. It may not cover all possible information. If you have questions about this medicine, talk to your doctor, pharmacist, or health care provider.  2020 Elsevier/Gold Standard (2012-07-01 09:36:48)  Dacarbazine, DTIC injection What is this medicine? DACARBAZINE (da KAR ba zeen) is a chemotherapy drug. This medicine is used to treat skin cancer. It is also used with other medicines to treat Hodgkin's disease. This medicine may be used for other purposes; ask your health care provider or pharmacist if you have questions. COMMON BRAND NAME(S): DTIC-Dome What should I tell my health care provider before I take this medicine? They need to know if you have any of these conditions:  infection (especially virus infection such as chickenpox, cold sores, or herpes)  kidney disease  liver disease  low blood counts like low platelets, red blood cells, white blood cells  recent radiation therapy  an unusual or allergic reaction to dacarbazine, other chemotherapy agents, other medicines, foods, dyes, or preservatives  pregnant or trying to get pregnant  breast-feeding How should I use this  medicine? This drug is given as an injection or infusion into a vein. It is administered in a hospital or clinic by a specially trained health care professional. Talk to your pediatrician regarding the use of this medicine in children. While this drug may be prescribed for selected conditions, precautions do apply. Overdosage: If you think you have taken too much of this medicine contact a poison control center or emergency room at once. NOTE: This medicine is only for you. Do not share this medicine with others. What if I miss a dose? It is important not to miss your dose. Call your doctor or health care professional if you are unable to keep an appointment. What may interact with this medicine?  medicines to increase blood counts like filgrastim, pegfilgrastim, sargramostim  vaccines This list may not describe all possible interactions. Give your health care provider a list of all the medicines, herbs, non-prescription drugs, or dietary supplements you use. Also tell them if you smoke, drink alcohol, or use illegal drugs. Some items may interact with your medicine. What should I watch for while using this medicine? Your condition will be monitored carefully while you are receiving this medicine. You will need important blood work done while you are taking this medicine. This drug may make you feel generally unwell. This is not uncommon, as chemotherapy can affect healthy cells as well as cancer cells. Report any side effects. Continue your course of treatment even though you feel ill unless your doctor tells you to stop. Call your doctor or health care professional for advice if you get a fever, chills or sore throat, or other symptoms of a cold or flu. Do not treat yourself. This drug decreases your body's ability to fight infections. Try to avoid being around people who are sick. This medicine may increase your risk to bruise or bleed. Call your doctor or health care professional if you notice  any unusual bleeding. Talk to your doctor about your risk of cancer. You may be more at risk for certain  types of cancers if you take this medicine. Do not become pregnant while taking this medicine. Women should inform their doctor if they wish to become pregnant or think they might be pregnant. There is a potential for serious side effects to an unborn child. Talk to your health care professional or pharmacist for more information. Do not breast-feed an infant while taking this medicine. What side effects may I notice from receiving this medicine? Side effects that you should report to your doctor or health care professional as soon as possible:  allergic reactions like skin rash, itching or hives, swelling of the face, lips, or tongue  low blood counts - this medicine may decrease the number of white blood cells, red blood cells and platelets. You may be at increased risk for infections and bleeding.  signs of infection - fever or chills, cough, sore throat, pain or difficulty passing urine  signs of decreased platelets or bleeding - bruising, pinpoint red spots on the skin, black, tarry stools, blood in the urine  signs of decreased red blood cells - unusually weak or tired, fainting spells, lightheadedness  breathing problems  muscle pains  pain at site where injected  trouble passing urine or change in the amount of urine  vomiting  yellowing of the eyes or skin Side effects that usually do not require medical attention (report to your doctor or health care professional if they continue or are bothersome):  diarrhea  hair loss  loss of appetite  nausea  skin more sensitive to sun or ultraviolet light  stomach upset This list may not describe all possible side effects. Call your doctor for medical advice about side effects. You may report side effects to FDA at 1-800-FDA-1088. Where should I keep my medicine? This drug is given in a hospital or clinic and will not be  stored at home. NOTE: This sheet is a summary. It may not cover all possible information. If you have questions about this medicine, talk to your doctor, pharmacist, or health care provider.  2020 Elsevier/Gold Standard (2015-05-06 15:17:39)

## 2019-11-27 ENCOUNTER — Telehealth: Payer: Self-pay | Admitting: *Deleted

## 2019-11-27 ENCOUNTER — Encounter: Payer: Self-pay | Admitting: General Practice

## 2019-11-27 ENCOUNTER — Telehealth: Payer: Self-pay | Admitting: Hematology

## 2019-11-27 NOTE — Progress Notes (Signed)
[place] Note  Reached [place] briefly by phone per referral from [place] to confirm that the [facility] support groups are indeed meeting via [place] during covid. We plan for me to follow up by phone next week so that we can talk in more detail.   [place], [place], [facility] Pager [phone] Voicemail [phone]

## 2019-11-27 NOTE — Telephone Encounter (Signed)
Called patient to confirm upcoming appointments. No answer. I left a message for patient with upcoming appointments details.

## 2019-11-30 ENCOUNTER — Encounter: Payer: Self-pay | Admitting: General Practice

## 2019-11-30 NOTE — Progress Notes (Signed)
[facility] Spiritual Care Note  Phoned for follow-up as planned; left voicemail encouraging callback.   [place], [place], [facility] Pager [phone] Voicemail [phone]

## 2019-12-03 ENCOUNTER — Inpatient Hospital Stay: Payer: 59

## 2019-12-03 ENCOUNTER — Other Ambulatory Visit: Payer: Self-pay

## 2019-12-03 ENCOUNTER — Inpatient Hospital Stay: Payer: 59 | Admitting: Hematology

## 2019-12-03 VITALS — BP 133/85 | HR 73 | Temp 98.3°F | Resp 18 | Ht 74.0 in | Wt 150.1 lb

## 2019-12-03 DIAGNOSIS — Z23 Encounter for immunization: Secondary | ICD-10-CM | POA: Diagnosis not present

## 2019-12-03 DIAGNOSIS — Z5111 Encounter for antineoplastic chemotherapy: Secondary | ICD-10-CM | POA: Diagnosis not present

## 2019-12-03 DIAGNOSIS — D638 Anemia in other chronic diseases classified elsewhere: Secondary | ICD-10-CM | POA: Diagnosis not present

## 2019-12-03 DIAGNOSIS — C8118 Nodular sclerosis classical Hodgkin lymphoma, lymph nodes of multiple sites: Secondary | ICD-10-CM | POA: Diagnosis not present

## 2019-12-03 LAB — CBC WITH DIFFERENTIAL/PLATELET
Abs Immature Granulocytes: 0.05 10*3/uL (ref 0.00–0.07)
Basophils Absolute: 0.1 10*3/uL (ref 0.0–0.1)
Basophils Relative: 1 %
Eosinophils Absolute: 0.2 10*3/uL (ref 0.0–0.5)
Eosinophils Relative: 3 %
HCT: 36.9 % (ref 36.0–46.0)
Hemoglobin: 12.2 g/dL (ref 12.0–15.0)
Immature Granulocytes: 1 %
Lymphocytes Relative: 35 %
Lymphs Abs: 2.2 10*3/uL (ref 0.7–4.0)
MCH: 27.5 pg (ref 26.0–34.0)
MCHC: 33.1 g/dL (ref 30.0–36.0)
MCV: 83.1 fL (ref 80.0–100.0)
Monocytes Absolute: 0.1 10*3/uL (ref 0.1–1.0)
Monocytes Relative: 1 %
Neutro Abs: 3.7 10*3/uL (ref 1.7–7.7)
Neutrophils Relative %: 59 %
Platelets: 418 10*3/uL — ABNORMAL HIGH (ref 150–400)
RBC: 4.44 MIL/uL (ref 3.87–5.11)
RDW: 13.5 % (ref 11.5–15.5)
WBC: 6.3 10*3/uL (ref 4.0–10.5)
nRBC: 0 % (ref 0.0–0.2)

## 2019-12-03 LAB — CMP (CANCER CENTER ONLY)
ALT: 23 U/L (ref 0–44)
AST: 18 U/L (ref 15–41)
Albumin: 3.7 g/dL (ref 3.5–5.0)
Alkaline Phosphatase: 94 U/L (ref 38–126)
Anion gap: 7 (ref 5–15)
BUN: 11 mg/dL (ref 6–20)
CO2: 26 mmol/L (ref 22–32)
Calcium: 9.6 mg/dL (ref 8.9–10.3)
Chloride: 104 mmol/L (ref 98–111)
Creatinine: 0.74 mg/dL (ref 0.44–1.00)
GFR, Est AFR Am: >60 mL/min (ref >60)
GFR, Estimated: >60 mL/min (ref >60)
Glucose, Bld: 70 mg/dL (ref 70–99)
Potassium: 4.2 mmol/L (ref 3.5–5.1)
Sodium: 137 mmol/L (ref 135–145)
Total Bilirubin: 0.3 mg/dL (ref 0.3–1.2)
Total Protein: 7.8 g/dL (ref 6.5–8.1)

## 2019-12-03 LAB — PHOSPHORUS: Phosphorus: 3.7 mg/dL (ref 2.5–4.6)

## 2019-12-03 LAB — URIC ACID: Uric Acid, Serum: 5.4 mg/dL (ref 2.5–7.1)

## 2019-12-03 LAB — MAGNESIUM: Magnesium: 2 mg/dL (ref 1.7–2.4)

## 2019-12-03 NOTE — Progress Notes (Signed)
12/03/2019 Sonia Doyle was observed post Covid-19 immunization for 30 mins without incident. She was provided with vaccin information sheet and instruction to access the V-Safe system.

## 2019-12-03 NOTE — Progress Notes (Signed)
HEMATOLOGY/ONCOLOGY CONSULTATION NOTE  Date of Service: 12/03/2019  Patient Care Team: Pcp, No as PCP - General  CHIEF COMPLAINTS/PURPOSE OF CONSULTATION:  Nodular sclerosis classical Hodgkin lymphoma  HISTORY OF PRESENTING ILLNESS:   Sonia Doyle is a wonderful 24 y.o. female who has been referred to [place] by Dr. Fonda Kinder for evaluation and management of newly diagnosed nodular sclerosis CHL. Pt is accompanied today by her mother. The pt reports that she is doing well overall.   The pt reports that she has asthma and has had a wisdom tooth extraction and tonsillectomy. She is only using her inhaler as needed at this time. She has no other known medical issues or medication allergies. 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 She has a family history significant for skin cancers.   Pt noticed an enlarged right-sided supraclavicular lymph node, which lead to the work up. She has since noticed more enlarged lymph nodes, but also a reduction in the size of the lymph node that initially concerned her. She had a fever in June that was accompanied by chills and night sweats. Pt denies weight loss, but has had multiple itchy rashes.   Pt has not had a port placed yet, as she had to cancel her previous appointment. Pt is scheduled for a consultation at [facility] later today. She has been set up for ovum harvesting in about 7-10 days for fertility preservation. She is currently on Menopur.   Prior to her referral pt received one IV Iron test dose, which caused her to be hoarse. They did not attempt any further IV Iron infusions. She was told to continue PO Iron.   08/24/2019 US Soft Tissue revealed "Biliteral supraclavicular nodules with atypical featues for lymph nodes, consider further evaluation with enhanced CT of the chest, abdomen and pelvis for the presence of additional adenopathy".   09/03/2019 CT Chest revealed  "Anterior mediastinal, bilateral hilar and neck adenopathy most suggestive of Lymphoma".   10/09/2019 Right Cervical Lymph Node Biopsy revealed "Classic Hodgkin's lymphoma, nodular sclerosis type".   10/09/2019 Flow Cytometry Report revealed "Negative for B-cell lymphoproliferative disorder or aberrant T-cell immunophenotype"  10/27/2019 ECHO was normal  11/03/2019 Left Iliac Bone BM Bx revealed  "-Normocellular to slightly hypercellular marrow with trilineage hematopoiesis and adequate magakaryocytes - Negative for lymphoma or other form of malignancy".  11/05/2019 PET/CT revealed  "Hypermetabolic lymphadeopathy involving multiple nodal stations in the neck and chest, consistent with reported history of lymphoma."   Most recent lab results (10/20/2019) of CBC w/diff & CMP is as follows: all values are WNL except for WBC at 14.8K, PLT at 588K, MPV at 6.8, Neutro Abs at 12.08K, Glucose at 68, Albumin at 3.0, ALP at 147. 10/20/2019 LDH at 279 09/15/2019 Sed Rate at 73  On review of systems, pt reports fevers, chills, night sweats, rash, new lumps/bumps and denies unexpected weight loss, constipation, abdominal pain and any other symptoms.   On PMHx the pt reports Asthma, Tonsillectomy, Wisdom Tooth Extraction. On Social Hx the pt reports that she is a non-smoker and does not drink much alcohol outside of social situations.  On Family Hx the pt reports skin cancer.   INTERVAL HISTORY:  Sonia Doyle is a wonderful 24 y.o. female who is here for evaluation and management of newly diagnosed nodular sclerosis CHL. She is here for a toxicity check after C1D1 of ABVD. The patient's last visit with [place]  was on 11/25/2019. The pt reports that she is doing well overall.  The pt reports that she took Zofran one extra day but did not require any other supportive medications. Pt denies any nausea or mouth sores, but had increased tongue sensitivity for two days. She is eating, sleeping, and hydrating  well. Pt notes that her itchiness has resolved.  Lab results today (12/03/19) of CBC w/diff and CMP is as follows: all values are WNL except for PLT at 418K. 12/03/2019 Phosphorus at 3.7 12/05/2019 Magnesium at 2.0 12/05/2019 Uric acid at 5.4  On review of systems, pt denies mouth sores, rash, pain at port site, numbness/tingling in hands/feet, nausea, itching, constipation, leg swelling, sleeplessness and any other symptoms.    MEDICAL HISTORY:  Childhood Asthma - not requiring preventative asthma medications/inhalers at present time   SURGICAL HISTORY: Tonsillectomy (2003) Wisdom Tooth Extraction    SOCIAL HISTORY: Social History   Socioeconomic History  . Marital status: Single    Spouse name: Not on file  . Number of children: Not on file  . Years of education: Not on file  . Highest education level: Not on file  Occupational History  . Not on file  Tobacco Use  . Smoking status: Never Smoker  . Smokeless tobacco: Never Used  Substance and Sexual Activity  . Alcohol use: Not on file  . Drug use: Not on file  . Sexual activity: Not on file  Other Topics Concern  . Not on file  Social History Narrative  . Not on file   Social Determinants of Health   Financial Resource Strain:   . Difficulty of Paying Living Expenses: Not on file  Food Insecurity:   . Worried About Charity fundraiser in the Last Year: Not on file  . Ran Out of Food in the Last Year: Not on file  Transportation Needs:   . Lack of Transportation (Medical): Not on file  . Lack of Transportation (Non-Medical): Not on file  Physical Activity:   . Days of Exercise per Week: Not on file  . Minutes of Exercise per Session: Not on file  Stress:   . Feeling of Stress : Not on file  Social Connections:   . Frequency of Communication with Friends and Family: Not on file  . Frequency of Social Gatherings with Friends and Family: Not on file  . Attends Religious Services: Not on file  . Active Member  of Clubs or Organizations: Not on file  . Attends Archivist Meetings: Not on file  . Marital Status: Not on file  Intimate Partner Violence:   . Fear of Current or Ex-Partner: Not on file  . Emotionally Abused: Not on file  . Physically Abused: Not on file  . Sexually Abused: Not on file    FAMILY HISTORY: Hypothyroidism - Mother  Atrial fibrillation - Maternal Grandmother Diabetes type II - Paternal Grandmother  Dementia - Paternal Grandmother   ALLERGIES:  has No Known Allergies.  MEDICATIONS:  Current Outpatient Medications  Medication Sig Dispense Refill  . albuterol (VENTOLIN HFA) 108 (90 Base) MCG/ACT inhaler Inhale 1 puff into the lungs as needed for wheezing or shortness of breath. 1 each 0  . dexamethasone (DECADRON) 4 MG tablet Take 2 tablets by mouth once a day for 3 days after chemotherapy. Take with food. 30 tablet 1  . ferrous sulfate 325 (65 FE) MG tablet Take 325 mg by mouth daily with breakfast.    . fluticasone (FLOVENT HFA) 110  MCG/ACT inhaler Inhale 1 puff into the lungs as needed. 1 each 0  . lidocaine-prilocaine (EMLA) cream Apply to affected area once 30 g 3  . LORazepam (ATIVAN) 0.5 MG tablet Take 1 tablet (0.5 mg total) by mouth every 6 (six) hours as needed (Nausea or vomiting). 30 tablet 0  . ondansetron (ZOFRAN) 8 MG tablet Take 1 tablet (8 mg total) by mouth 2 (two) times daily as needed. Start on the third day after chemotherapy. 30 tablet 1  . prochlorperazine (COMPAZINE) 10 MG tablet Take 1 tablet (10 mg total) by mouth every 6 (six) hours as needed (Nausea or vomiting). 30 tablet 1   No current facility-administered medications for this visit.    REVIEW OF SYSTEMS:   A 10+ POINT REVIEW OF SYSTEMS WAS OBTAINED including neurology, dermatology, psychiatry, cardiac, respiratory, lymph, extremities, GI, GU, Musculoskeletal, constitutional, breasts, reproductive, HEENT.  All pertinent positives are noted in the HPI.  All others are negative.    PHYSICAL EXAMINATION: ECOG PERFORMANCE STATUS: 1 - Symptomatic but completely ambulatory  . Vitals:   12/03/19 1151  BP: 133/85  Pulse: 73  Resp: 18  Temp: 98.3 F (36.8 C)  SpO2: 100%   Filed Weights   12/03/19 1151  Weight: 150 lb 1.6 oz (68.1 kg)   .Body mass index is 19.27 kg/m.   GENERAL:alert, in no acute distress and comfortable SKIN: no acute rashes, no significant lesions EYES: conjunctiva are pink and non-injected, sclera anicteric OROPHARYNX: MMM, no exudates, no oropharyngeal erythema or ulceration NECK: supple, no JVD LYMPH:  no palpable lymphadenopathy in the inguinal region. Improved B/L cervical and axillary lymphadenopathy. HEART: regular rate & rhythm ABDOMEN:  normoactive bowel sounds , non tender, not distended. No palpable hepatosplenomegaly.  Extremity: no pedal edema PSYCH: alert & oriented x 3 with fluent speech NEURO: no focal motor/sensory deficits  LABORATORY DATA:  I have reviewed the data as listed  . CBC Latest Ref Rng & Units 12/03/2019 11/25/2019 11/19/2019  WBC 4.0 - 10.5 K/uL 6.3 14.4(H) 16.3(H)  Hemoglobin 12.0 - 15.0 g/dL 12.2 10.3(L) 13.2  Hematocrit 36 - 46 % 36.9 31.5(L) 41.1  Platelets 150 - 400 K/uL 418(H) 364 500(H)    . CMP Latest Ref Rng & Units 12/03/2019 11/25/2019 11/19/2019  Glucose 70 - 99 mg/dL 70 92 86  BUN 6 - 20 mg/dL 11 8 10   Creatinine 0.44 - 1.00 mg/dL 0.74 0.71 0.56  Sodium 135 - 145 mmol/L 137 137 139  Potassium 3.5 - 5.1 mmol/L 4.2 4.2 3.8  Chloride 98 - 111 mmol/L 104 105 104  CO2 22 - 32 mmol/L 26 27 25   Calcium 8.9 - 10.3 mg/dL 9.6 8.8(L) 9.5  Total Protein 6.5 - 8.1 g/dL 7.8 6.7 -  Total Bilirubin 0.3 - 1.2 mg/dL 0.3 0.3 -  Alkaline Phos 38 - 126 U/L 94 108 -  AST 15 - 41 U/L 18 12(L) -  ALT 0 - 44 U/L 23 <6 -     RADIOGRAPHIC STUDIES: I have personally reviewed the radiological images as listed and agreed with the findings in the report. IR IMAGING GUIDED PORT INSERTION  Result Date:  11/19/2019 CLINICAL DATA:  Hodgkin's lymphoma and need for porta cath to begin chemotherapy. EXAM: IMPLANTED PORT A CATH PLACEMENT WITH ULTRASOUND AND FLUOROSCOPIC GUIDANCE ANESTHESIA/SEDATION: 3.0 mg IV Versed; 100 mcg IV Fentanyl Total Moderate Sedation Time:  31 minutes The patient's level of consciousness and physiologic status were continuously monitored during the procedure by Radiology nursing. Additional Medications:  2 g IV Ancef. FLUOROSCOPY TIME:  1 minute and 12 seconds.  5.0 mGy. PROCEDURE: The procedure, risks, benefits, and alternatives were explained to the patient. Questions regarding the procedure were encouraged and answered. The patient understands and consents to the procedure. A time-out was performed prior to initiating the procedure. Ultrasound was utilized to confirm patency of the right internal jugular vein. The right neck and chest were prepped with chlorhexidine in a sterile fashion, and a sterile drape was applied covering the operative field. Maximum barrier sterile technique with sterile gowns and gloves were used for the procedure. Local anesthesia was provided with 1% lidocaine. After creating a small venotomy incision, a 21 gauge needle was advanced into the right internal jugular vein under direct, real-time ultrasound guidance. Ultrasound image documentation was performed. After securing guidewire access, an 8 Fr dilator was placed. A J-wire was kinked to measure appropriate catheter length. A subcutaneous port pocket was then created along the upper chest wall utilizing sharp and blunt dissection. Portable cautery was utilized. The pocket was irrigated with sterile saline. A single lumen power injectable port was chosen for placement. The 8 Fr catheter was tunneled from the port pocket site to the venotomy incision. The port was placed in the pocket. External catheter was trimmed to appropriate length based on guidewire measurement. At the venotomy, an 8 Fr peel-away sheath was  placed over a guidewire. The catheter was then placed through the sheath and the sheath removed. Final catheter positioning was confirmed and documented with a fluoroscopic spot image. The port was accessed with a needle and aspirated and flushed with heparinized saline. The access needle was removed. The venotomy and port pocket incisions were closed with subcutaneous 3-0 Monocryl and subcuticular 4-0 Vicryl. Dermabond was applied to both incisions. COMPLICATIONS: COMPLICATIONS None FINDINGS: After catheter placement, the tip lies at the cavo-atrial junction. The catheter aspirates normally and is ready for immediate use. IMPRESSION: Placement of single lumen port a cath via right internal jugular vein. The catheter tip lies at the cavo-atrial junction. A power injectable port a cath was placed and is ready for immediate use. Electronically Signed   By: Aletta Edouard M.D.   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  -11/05/2019 PET/CT revealed  "Hypermetabolic lymphadeopathy involving multiple nodal stations in the neck and chest, consistent with reported history of lymphoma."  2) Anemia of chronic disease. PLAN: -Discussed pt labwork today, 12/03/19; anemia has resolved, WBC & PLT are good, blood chemistries are nml; Phosphorus, Magnesium, and Uric acid are WNL.  -The pt has no prohibitive toxicities from continuing C1D15 ABVD in 1 week -Advised pt that some side effects, like fatigue, can be cumulative. Others, like neuropathy, typically improve after treatment.  -Advised pt that cytopenias can develop during subsequent treatments, but we will continue to watch closely.  -Would prefer to hold G-CSF support if possible. Not as much of a concern due to pt's age and lack of  other medical issues. -Recommended that the pt continue to eat well, drink at least 48-64 oz of water each day, and walk 20-30 minutes each day.  -Discussed CDC guidelines regarding COVID19 booster. Will give in clinic today.  -Recommend pt receive the annual flu vaccine when available. -Continue to use salt/baking soda mouth rinses 3-4 times  per day. -Recommend pt continue PO Iron and 2000-3000 UT Vitamin D daily. -Recommend pt begin B-complex vitamin for nerve health. -Will give pt a wig prescription -Will see back with C1D15    FOLLOW UP: -Covid vaccine booster today -plz add MD visit to [place] on 9/23 (can double book and will see in infusion) -Plz schedule C2D1 and C2D15 as ordered with portflush, labs and MD visits   The total time spent in the appt was 20 minutes and more than 50% was on counseling and direct patient cares.  All of the patient's questions were answered with apparent satisfaction. The patient knows to call the clinic with any problems, questions or concerns.    Sullivan Lone MD [place] AAHIVMS [facility] Hematology/Oncology Physician [facility]  (Office):       [phone] (Work cell):  [phone] (Fax):           [phone]  12/03/2019 1:39 PM   I, Yevette Edwards, am acting as a scribe for Dr. Sullivan Lone.   .I have reviewed the above documentation for accuracy and completeness, and I agree with the above. Brunetta Genera MD

## 2019-12-09 ENCOUNTER — Telehealth: Payer: Self-pay | Admitting: *Deleted

## 2019-12-09 ENCOUNTER — Other Ambulatory Visit: Payer: Self-pay | Admitting: *Deleted

## 2019-12-09 DIAGNOSIS — C8118 Nodular sclerosis classical Hodgkin lymphoma, lymph nodes of multiple sites: Secondary | ICD-10-CM

## 2019-12-09 NOTE — Telephone Encounter (Signed)
Received faxed request for most recent OV note and Plan of care from [facility] Nurse Case Manager Mike Gip  Documents faxed to [phone]. Fax confirmation received

## 2019-12-10 ENCOUNTER — Ambulatory Visit: Payer: 59 | Admitting: Hematology

## 2019-12-10 ENCOUNTER — Other Ambulatory Visit: Payer: Self-pay

## 2019-12-10 ENCOUNTER — Inpatient Hospital Stay: Payer: 59

## 2019-12-10 ENCOUNTER — Inpatient Hospital Stay (HOSPITAL_BASED_OUTPATIENT_CLINIC_OR_DEPARTMENT_OTHER): Payer: 59 | Admitting: Hematology

## 2019-12-10 ENCOUNTER — Telehealth: Payer: Self-pay | Admitting: Hematology

## 2019-12-10 VITALS — BP 110/72 | HR 68 | Temp 98.4°F | Resp 16 | Wt 148.5 lb

## 2019-12-10 DIAGNOSIS — C8118 Nodular sclerosis classical Hodgkin lymphoma, lymph nodes of multiple sites: Secondary | ICD-10-CM

## 2019-12-10 DIAGNOSIS — Z7189 Other specified counseling: Secondary | ICD-10-CM

## 2019-12-10 DIAGNOSIS — D702 Other drug-induced agranulocytosis: Secondary | ICD-10-CM | POA: Diagnosis not present

## 2019-12-10 DIAGNOSIS — Z95828 Presence of other vascular implants and grafts: Secondary | ICD-10-CM

## 2019-12-10 DIAGNOSIS — Z5111 Encounter for antineoplastic chemotherapy: Secondary | ICD-10-CM

## 2019-12-10 LAB — CMP (CANCER CENTER ONLY)
ALT: 14 U/L (ref 0–44)
AST: 17 U/L (ref 15–41)
Albumin: 3.6 g/dL (ref 3.5–5.0)
Alkaline Phosphatase: 80 U/L (ref 38–126)
Anion gap: 6 (ref 5–15)
BUN: 9 mg/dL (ref 6–20)
CO2: 28 mmol/L (ref 22–32)
Calcium: 9.4 mg/dL (ref 8.9–10.3)
Chloride: 105 mmol/L (ref 98–111)
Creatinine: 0.72 mg/dL (ref 0.44–1.00)
GFR, Est AFR Am: >60 mL/min (ref >60)
GFR, Estimated: >60 mL/min (ref >60)
Glucose, Bld: 81 mg/dL (ref 70–99)
Potassium: 4.2 mmol/L (ref 3.5–5.1)
Sodium: 139 mmol/L (ref 135–145)
Total Bilirubin: 0.3 mg/dL (ref 0.3–1.2)
Total Protein: 7.1 g/dL (ref 6.5–8.1)

## 2019-12-10 LAB — CBC WITH DIFFERENTIAL (CANCER CENTER ONLY)
Abs Immature Granulocytes: 0 10*3/uL (ref 0.00–0.07)
Basophils Absolute: 0.1 10*3/uL (ref 0.0–0.1)
Basophils Relative: 3 %
Eosinophils Absolute: 0.1 10*3/uL (ref 0.0–0.5)
Eosinophils Relative: 3 %
HCT: 35.1 % — ABNORMAL LOW (ref 36.0–46.0)
Hemoglobin: 11.5 g/dL — ABNORMAL LOW (ref 12.0–15.0)
Immature Granulocytes: 0 %
Lymphocytes Relative: 71 %
Lymphs Abs: 2 10*3/uL (ref 0.7–4.0)
MCH: 27.4 pg (ref 26.0–34.0)
MCHC: 32.8 g/dL (ref 30.0–36.0)
MCV: 83.8 fL (ref 80.0–100.0)
Monocytes Absolute: 0.4 10*3/uL (ref 0.1–1.0)
Monocytes Relative: 15 %
Neutro Abs: 0.2 10*3/uL — CL (ref 1.7–7.7)
Neutrophils Relative %: 8 %
Platelet Count: 361 10*3/uL (ref 150–400)
RBC: 4.19 MIL/uL (ref 3.87–5.11)
RDW: 13.8 % (ref 11.5–15.5)
WBC Count: 2.7 10*3/uL — ABNORMAL LOW (ref 4.0–10.5)
nRBC: 0 % (ref 0.0–0.2)

## 2019-12-10 MED ORDER — SODIUM CHLORIDE 0.9% FLUSH
10.0000 mL | INTRAVENOUS | Status: DC | PRN
  Administered 2019-12-10: 10 mL
  Filled 2019-12-10: qty 10

## 2019-12-10 MED ORDER — FILGRASTIM-SNDZ 480 MCG/0.8ML IJ SOSY
480.0000 ug | PREFILLED_SYRINGE | Freq: Once | INTRAMUSCULAR | Status: AC
  Administered 2019-12-10: 480 ug via SUBCUTANEOUS

## 2019-12-10 MED ORDER — HEPARIN SOD (PORK) LOCK FLUSH 100 UNIT/ML IV SOLN
500.0000 [IU] | Freq: Once | INTRAVENOUS | Status: AC | PRN
  Administered 2019-12-10: 500 [IU]
  Filled 2019-12-10: qty 5

## 2019-12-10 MED ORDER — TBO-FILGRASTIM 480 MCG/0.8ML ~~LOC~~ SOSY: 480.0000 ug | PREFILLED_SYRINGE | Freq: Once | SUBCUTANEOUS | Status: DC

## 2019-12-10 MED ORDER — FILGRASTIM-SNDZ 480 MCG/0.8ML IJ SOSY
PREFILLED_SYRINGE | INTRAMUSCULAR | Status: AC
  Filled 2019-12-10: qty 0.8

## 2019-12-10 NOTE — Progress Notes (Signed)
Pt. ANC 0.2 per Dr. Irene Limbo no treatment today, but to receive a dose of Granix. Pt. To return next week for treatment.

## 2019-12-10 NOTE — Patient Instructions (Signed)

## 2019-12-10 NOTE — Patient Instructions (Addendum)

## 2019-12-10 NOTE — Telephone Encounter (Signed)
Scheduled appt per 9/23 for inj. Waiting on charge for r/s chemo - left message for patient with 9/24 and 9/25 appts.

## 2019-12-10 NOTE — Progress Notes (Signed)
HEMATOLOGY/ONCOLOGY CONSULTATION NOTE  Date of Service: 12/10/2019  Patient Care Team: Pcp, No as PCP - General  CHIEF COMPLAINTS/PURPOSE OF CONSULTATION:  Nodular sclerosis classical Hodgkin lymphoma  HISTORY OF PRESENTING ILLNESS:   Sonia Doyle is a wonderful 24 y.o. female who has been referred to [place] by Dr. Fonda Kinder for evaluation and management of newly diagnosed nodular sclerosis CHL. Pt is accompanied today by her mother. The pt reports that she is doing well overall.   The pt reports that she has asthma and has had a wisdom tooth extraction and tonsillectomy. She is only using her inhaler as needed at this time. She has no other known medical issues or medication allergies. 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 She has a family history significant for skin cancers.   Pt noticed an enlarged right-sided supraclavicular lymph node, which lead to the work up. She has since noticed more enlarged lymph nodes, but also a reduction in the size of the lymph node that initially concerned her. She had a fever in June that was accompanied by chills and night sweats. Pt denies weight loss, but has had multiple itchy rashes.   Pt has not had a port placed yet, as she had to cancel her previous appointment. Pt is scheduled for a consultation at [facility] later today. She has been set up for ovum harvesting in about 7-10 days for fertility preservation. She is currently on Menopur.   Prior to her referral pt received one IV Iron test dose, which caused her to be hoarse. They did not attempt any further IV Iron infusions. She was told to continue PO Iron.   08/24/2019 US Soft Tissue revealed "Biliteral supraclavicular nodules with atypical featues for lymph nodes, consider further evaluation with enhanced CT of the chest, abdomen and pelvis for the presence of additional adenopathy".   09/03/2019 CT Chest revealed  "Anterior mediastinal, bilateral hilar and neck adenopathy most suggestive of Lymphoma".   10/09/2019 Right Cervical Lymph Node Biopsy revealed "Classic Hodgkin's lymphoma, nodular sclerosis type".   10/09/2019 Flow Cytometry Report revealed "Negative for B-cell lymphoproliferative disorder or aberrant T-cell immunophenotype"  10/27/2019 ECHO was normal  11/03/2019 Left Iliac Bone BM Bx revealed  "-Normocellular to slightly hypercellular marrow with trilineage hematopoiesis and adequate magakaryocytes - Negative for lymphoma or other form of malignancy".  11/05/2019 PET/CT revealed  "Hypermetabolic lymphadeopathy involving multiple nodal stations in the neck and chest, consistent with reported history of lymphoma."   Most recent lab results (10/20/2019) of CBC w/diff & CMP is as follows: all values are WNL except for WBC at 14.8K, PLT at 588K, MPV at 6.8, Neutro Abs at 12.08K, Glucose at 68, Albumin at 3.0, ALP at 147. 10/20/2019 LDH at 279 09/15/2019 Sed Rate at 73  On review of systems, pt reports fevers, chills, night sweats, rash, new lumps/bumps and denies unexpected weight loss, constipation, abdominal pain and any other symptoms.   On PMHx the pt reports Asthma, Tonsillectomy, Wisdom Tooth Extraction. On Social Hx the pt reports that she is a non-smoker and does not drink much alcohol outside of social situations.  On Family Hx the pt reports skin cancer.   INTERVAL HISTORY:  Sonia Doyle is a wonderful 24 y.o. female who is here for evaluation and management of newly diagnosed nodular sclerosis CHL. She is here for after C1D15 of ABVD. The patient's last visit with [place] was on 12/03/2019.  The pt reports that she is doing well overall.  The pt reports that her itching & rash have returned in an intermittent manner. She denies any other new or bothersome symptoms. Pt is interested in having her boyfriend visit soon.   Lab results today (12/10/19) of CBC w/diff and CMP is as follows:  all values are WNL except for WBC at 2.7K, Hgb at 11.5, HCT at 35.1, Neutro Abs at 0.2K.  On review of systems, pt reports intermittent rash and itching and denies fevers, chills, mouth sores, diarrhea, pain at port site, abdominal pain, constipation and any other symptoms.    MEDICAL HISTORY:  Childhood Asthma - not requiring preventative asthma medications/inhalers at present time   SURGICAL HISTORY: Tonsillectomy (2003) Wisdom Tooth Extraction    SOCIAL HISTORY: Social History   Socioeconomic History  . Marital status: Single    Spouse name: Not on file  . Number of children: Not on file  . Years of education: Not on file  . Highest education level: Not on file  Occupational History  . Not on file  Tobacco Use  . Smoking status: Never Smoker  . Smokeless tobacco: Never Used  Substance and Sexual Activity  . Alcohol use: Not on file  . Drug use: Not on file  . Sexual activity: Not on file  Other Topics Concern  . Not on file  Social History Narrative  . Not on file   Social Determinants of Health   Financial Resource Strain:   . Difficulty of Paying Living Expenses: Not on file  Food Insecurity:   . Worried About Charity fundraiser in the Last Year: Not on file  . Ran Out of Food in the Last Year: Not on file  Transportation Needs:   . Lack of Transportation (Medical): Not on file  . Lack of Transportation (Non-Medical): Not on file  Physical Activity:   . Days of Exercise per Week: Not on file  . Minutes of Exercise per Session: Not on file  Stress:   . Feeling of Stress : Not on file  Social Connections:   . Frequency of Communication with Friends and Family: Not on file  . Frequency of Social Gatherings with Friends and Family: Not on file  . Attends Religious Services: Not on file  . Active Member of Clubs or Organizations: Not on file  . Attends Archivist Meetings: Not on file  . Marital Status: Not on file  Intimate Partner Violence:   .  Fear of Current or Ex-Partner: Not on file  . Emotionally Abused: Not on file  . Physically Abused: Not on file  . Sexually Abused: Not on file    FAMILY HISTORY: Hypothyroidism - Mother  Atrial fibrillation - Maternal Grandmother Diabetes type II - Paternal Grandmother  Dementia - Paternal Grandmother   ALLERGIES:  has No Known Allergies.  MEDICATIONS:  Current Outpatient Medications  Medication Sig Dispense Refill  . albuterol (VENTOLIN HFA) 108 (90 Base) MCG/ACT inhaler Inhale 1 puff into the lungs as needed for wheezing or shortness of breath. 1 each 0  . dexamethasone (DECADRON) 4 MG tablet Take 2 tablets by mouth once a day for 3 days after chemotherapy. Take with food. 30 tablet 1  . ferrous sulfate 325 (65 FE) MG tablet Take 325 mg by mouth daily with breakfast.    . fluticasone (FLOVENT HFA) 110 MCG/ACT inhaler Inhale 1 puff into the lungs as needed. 1 each 0  . lidocaine-prilocaine (EMLA) cream Apply  to affected area once 30 g 3  . LORazepam (ATIVAN) 0.5 MG tablet Take 1 tablet (0.5 mg total) by mouth every 6 (six) hours as needed (Nausea or vomiting). 30 tablet 0  . ondansetron (ZOFRAN) 8 MG tablet Take 1 tablet (8 mg total) by mouth 2 (two) times daily as needed. Start on the third day after chemotherapy. 30 tablet 1  . prochlorperazine (COMPAZINE) 10 MG tablet Take 1 tablet (10 mg total) by mouth every 6 (six) hours as needed (Nausea or vomiting). 30 tablet 1   No current facility-administered medications for this visit.    REVIEW OF SYSTEMS:   A 10+ POINT REVIEW OF SYSTEMS WAS OBTAINED including neurology, dermatology, psychiatry, cardiac, respiratory, lymph, extremities, GI, GU, Musculoskeletal, constitutional, breasts, reproductive, HEENT.  All pertinent positives are noted in the HPI.  All others are negative.   PHYSICAL EXAMINATION: ECOG PERFORMANCE STATUS: 1 - Symptomatic but completely ambulatory  . VS reviewed Exam was given in a chair   GENERAL:alert, in  no acute distress and comfortable SKIN: no acute rashes, no significant lesions EYES: conjunctiva are pink and non-injected, sclera anicteric OROPHARYNX: MMM, no exudates, no oropharyngeal erythema or ulceration NECK: supple, no JVD LYMPH:  no palpable lymphadenopathy in the cervical, axillary or inguinal regions.  LUNGS: clear to auscultation b/l with normal respiratory effort HEART: regular rate & rhythm ABDOMEN:  normoactive bowel sounds , non tender, not distended. No palpable hepatosplenomegaly.  Extremity: no pedal edema PSYCH: alert & oriented x 3 with fluent speech NEURO: no focal motor/sensory deficits  LABORATORY DATA:  I have reviewed the data as listed  . CBC Latest Ref Rng & Units 12/10/2019 12/03/2019 11/25/2019  WBC 4.0 - 10.5 K/uL 2.7(L) 6.3 14.4(H)  Hemoglobin 12.0 - 15.0 g/dL 11.5(L) 12.2 10.3(L)  Hematocrit 36 - 46 % 35.1(L) 36.9 31.5(L)  Platelets 150 - 400 K/uL 361 418(H) 364   ANC 0.2k . CMP Latest Ref Rng & Units 12/10/2019 12/03/2019 11/25/2019  Glucose 70 - 99 mg/dL 81 70 92  BUN 6 - 20 mg/dL 9 11 8   Creatinine 0.44 - 1.00 mg/dL 0.72 0.74 0.71  Sodium 135 - 145 mmol/L 139 137 137  Potassium 3.5 - 5.1 mmol/L 4.2 4.2 4.2  Chloride 98 - 111 mmol/L 105 104 105  CO2 22 - 32 mmol/L 28 26 27   Calcium 8.9 - 10.3 mg/dL 9.4 9.6 8.8(L)  Total Protein 6.5 - 8.1 g/dL 7.1 7.8 6.7  Total Bilirubin 0.3 - 1.2 mg/dL 0.3 0.3 0.3  Alkaline Phos 38 - 126 U/L 80 94 108  AST 15 - 41 U/L 17 18 12(L)  ALT 0 - 44 U/L 14 23 <6     RADIOGRAPHIC STUDIES: I have personally reviewed the radiological images as listed and agreed with the findings in the report. IR IMAGING GUIDED PORT INSERTION  Result Date: 11/19/2019 CLINICAL DATA:  Hodgkin's lymphoma and need for porta cath to begin chemotherapy. EXAM: IMPLANTED PORT A CATH PLACEMENT WITH ULTRASOUND AND FLUOROSCOPIC GUIDANCE ANESTHESIA/SEDATION: 3.0 mg IV Versed; 100 mcg IV Fentanyl Total Moderate Sedation Time:  31 minutes The  patient's level of consciousness and physiologic status were continuously monitored during the procedure by Radiology nursing. Additional Medications: 2 g IV Ancef. FLUOROSCOPY TIME:  1 minute and 12 seconds.  5.0 mGy. PROCEDURE: The procedure, risks, benefits, and alternatives were explained to the patient. Questions regarding the procedure were encouraged and answered. The patient understands and consents to the procedure. A time-out was performed prior to initiating  the procedure. Ultrasound was utilized to confirm patency of the right internal jugular vein. The right neck and chest were prepped with chlorhexidine in a sterile fashion, and a sterile drape was applied covering the operative field. Maximum barrier sterile technique with sterile gowns and gloves were used for the procedure. Local anesthesia was provided with 1% lidocaine. After creating a small venotomy incision, a 21 gauge needle was advanced into the right internal jugular vein under direct, real-time ultrasound guidance. Ultrasound image documentation was performed. After securing guidewire access, an 8 Fr dilator was placed. A J-wire was kinked to measure appropriate catheter length. A subcutaneous port pocket was then created along the upper chest wall utilizing sharp and blunt dissection. Portable cautery was utilized. The pocket was irrigated with sterile saline. A single lumen power injectable port was chosen for placement. The 8 Fr catheter was tunneled from the port pocket site to the venotomy incision. The port was placed in the pocket. External catheter was trimmed to appropriate length based on guidewire measurement. At the venotomy, an 8 Fr peel-away sheath was placed over a guidewire. The catheter was then placed through the sheath and the sheath removed. Final catheter positioning was confirmed and documented with a fluoroscopic spot image. The port was accessed with a needle and aspirated and flushed with heparinized saline. The  access needle was removed. The venotomy and port pocket incisions were closed with subcutaneous 3-0 Monocryl and subcuticular 4-0 Vicryl. Dermabond was applied to both incisions. COMPLICATIONS: COMPLICATIONS None FINDINGS: After catheter placement, the tip lies at the cavo-atrial junction. The catheter aspirates normally and is ready for immediate use. IMPRESSION: Placement of single lumen port a cath via right internal jugular vein. The catheter tip lies at the cavo-atrial junction. A power injectable port a cath was placed and is ready for immediate use. Electronically Signed   By: Aletta Edouard M.D.   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  -11/05/2019 PET/CT revealed  "Hypermetabolic lymphadeopathy involving multiple nodal stations in the neck and chest, consistent with reported history of lymphoma."  2) Anemia of chronic disease. PLAN: -Discussed pt labwork today, 12/10/19; severe neutropenia, other blood counts are relatively stable, blood chemistries are nml.  -Advised pt that we would hold treatment today to prevent infections. Advised pt that fever would cause [place] to hold treatment for 1-2 weeks.  -Discussed proper safety precautions (ex. mask, social distancing) and recommend pt avoid any intimacy with others at this time.  -Recommend pt avoid outside foods, uncooked foods, crowds, working in the soil, and close contact with pets.  -Will give Granix daily x3 beginning today. Will continue G-CSF support with subsequent treatments  -Recommend pt continue salt/baking soda mouth rinses 3-4x per day.  -Recommend pt continue PO Iron and 2000-3000 UT Vitamin D daily. -Recommend pt begin daily B-complex vitamin.  -Will reschedule C1D15 of ABVD in 4 days.  -Recommend  pt contact immediately if she begins to experience a fever.    FOLLOW UP: Granix daily for 3 days Plz schedule ABVD C1D15 from today 9/23 to 9/27 with portflush and labs Plz schedule granix for 3 days after each treatment. Plz adjust dates on subsequent cycles of treatment   The total time spent in the appt was 30 minutes and more than 50% was  on counseling and direct patient cares, ordering and adjusting chemotherapy  All of the patient's questions were answered with apparent satisfaction. The patient knows to call the clinic with any problems, questions or concerns.    Sullivan Lone MD [place] AAHIVMS [facility] Hematology/Oncology Physician [facility]  (Office):       [phone] (Work cell):  [phone] (Fax):           [phone]  12/10/2019 1:30 PM   I, Yevette Edwards, am acting as a scribe for Dr. Sullivan Lone.   .I have reviewed the above documentation for accuracy and completeness, and I agree with the above. Brunetta Genera MD

## 2019-12-11 ENCOUNTER — Other Ambulatory Visit: Payer: Self-pay

## 2019-12-11 ENCOUNTER — Inpatient Hospital Stay: Payer: 59

## 2019-12-11 VITALS — BP 116/76 | HR 76 | Temp 98.1°F | Resp 18

## 2019-12-11 DIAGNOSIS — C8118 Nodular sclerosis classical Hodgkin lymphoma, lymph nodes of multiple sites: Secondary | ICD-10-CM

## 2019-12-11 DIAGNOSIS — Z7189 Other specified counseling: Secondary | ICD-10-CM

## 2019-12-11 DIAGNOSIS — Z5111 Encounter for antineoplastic chemotherapy: Secondary | ICD-10-CM | POA: Diagnosis not present

## 2019-12-11 MED ORDER — FILGRASTIM-SNDZ 480 MCG/0.8ML IJ SOSY
PREFILLED_SYRINGE | INTRAMUSCULAR | Status: AC
  Filled 2019-12-11: qty 0.8

## 2019-12-11 MED ORDER — FILGRASTIM-SNDZ 480 MCG/0.8ML IJ SOSY
480.0000 ug | PREFILLED_SYRINGE | Freq: Once | INTRAMUSCULAR | Status: AC
  Administered 2019-12-11: 480 ug via SUBCUTANEOUS

## 2019-12-11 NOTE — Patient Instructions (Signed)

## 2019-12-12 ENCOUNTER — Other Ambulatory Visit: Payer: Self-pay

## 2019-12-12 ENCOUNTER — Inpatient Hospital Stay: Payer: 59

## 2019-12-12 VITALS — BP 137/91 | HR 113 | Temp 97.0°F | Resp 21

## 2019-12-12 DIAGNOSIS — Z7189 Other specified counseling: Secondary | ICD-10-CM

## 2019-12-12 DIAGNOSIS — C8118 Nodular sclerosis classical Hodgkin lymphoma, lymph nodes of multiple sites: Secondary | ICD-10-CM

## 2019-12-12 DIAGNOSIS — Z5111 Encounter for antineoplastic chemotherapy: Secondary | ICD-10-CM | POA: Diagnosis not present

## 2019-12-12 MED ORDER — FILGRASTIM-SNDZ 480 MCG/0.8ML IJ SOSY
PREFILLED_SYRINGE | INTRAMUSCULAR | Status: AC
  Filled 2019-12-12: qty 0.8

## 2019-12-12 MED ORDER — FILGRASTIM-SNDZ 480 MCG/0.8ML IJ SOSY
480.0000 ug | PREFILLED_SYRINGE | Freq: Once | INTRAMUSCULAR | Status: AC
  Administered 2019-12-12: 480 ug via SUBCUTANEOUS

## 2019-12-12 NOTE — Patient Instructions (Signed)

## 2019-12-14 ENCOUNTER — Other Ambulatory Visit: Payer: Self-pay | Admitting: Hematology

## 2019-12-14 ENCOUNTER — Other Ambulatory Visit: Payer: Self-pay

## 2019-12-14 ENCOUNTER — Inpatient Hospital Stay: Payer: 59

## 2019-12-14 ENCOUNTER — Other Ambulatory Visit: Payer: Self-pay | Admitting: *Deleted

## 2019-12-14 VITALS — BP 120/85 | HR 90 | Temp 98.1°F | Resp 16 | Ht 70.0 in | Wt 147.8 lb

## 2019-12-14 DIAGNOSIS — C8118 Nodular sclerosis classical Hodgkin lymphoma, lymph nodes of multiple sites: Secondary | ICD-10-CM

## 2019-12-14 DIAGNOSIS — Z7189 Other specified counseling: Secondary | ICD-10-CM

## 2019-12-14 DIAGNOSIS — D702 Other drug-induced agranulocytosis: Secondary | ICD-10-CM

## 2019-12-14 DIAGNOSIS — Z5111 Encounter for antineoplastic chemotherapy: Secondary | ICD-10-CM | POA: Diagnosis not present

## 2019-12-14 DIAGNOSIS — Z95828 Presence of other vascular implants and grafts: Secondary | ICD-10-CM

## 2019-12-14 LAB — CBC WITH DIFFERENTIAL (CANCER CENTER ONLY)
Abs Immature Granulocytes: 8.3 10*3/uL — ABNORMAL HIGH (ref 0.00–0.07)
Basophils Absolute: 0.1 10*3/uL (ref 0.0–0.1)
Basophils Relative: 0 %
Eosinophils Absolute: 0.2 10*3/uL (ref 0.0–0.5)
Eosinophils Relative: 1 %
HCT: 37.8 % (ref 36.0–46.0)
Hemoglobin: 12.3 g/dL (ref 12.0–15.0)
Immature Granulocytes: 24 %
Lymphocytes Relative: 14 %
Lymphs Abs: 4.9 10*3/uL — ABNORMAL HIGH (ref 0.7–4.0)
MCH: 27.9 pg (ref 26.0–34.0)
MCHC: 32.5 g/dL (ref 30.0–36.0)
MCV: 85.7 fL (ref 80.0–100.0)
Monocytes Absolute: 3 10*3/uL — ABNORMAL HIGH (ref 0.1–1.0)
Monocytes Relative: 9 %
Neutro Abs: 18 10*3/uL — ABNORMAL HIGH (ref 1.7–7.7)
Neutrophils Relative %: 52 %
Platelet Count: 349 10*3/uL (ref 150–400)
RBC: 4.41 MIL/uL (ref 3.87–5.11)
RDW: 14.4 % (ref 11.5–15.5)
WBC Count: 34.4 10*3/uL — ABNORMAL HIGH (ref 4.0–10.5)
nRBC: 0 % (ref 0.0–0.2)

## 2019-12-14 LAB — CMP (CANCER CENTER ONLY)
ALT: 17 U/L (ref 0–44)
AST: 37 U/L (ref 15–41)
Albumin: 3.6 g/dL (ref 3.5–5.0)
Alkaline Phosphatase: 157 U/L — ABNORMAL HIGH (ref 38–126)
Anion gap: 9 (ref 5–15)
BUN: 8 mg/dL (ref 6–20)
CO2: 25 mmol/L (ref 22–32)
Calcium: 9.3 mg/dL (ref 8.9–10.3)
Chloride: 108 mmol/L (ref 98–111)
Creatinine: 0.87 mg/dL (ref 0.44–1.00)
GFR, Est AFR Am: >60 mL/min (ref >60)
GFR, Estimated: >60 mL/min (ref >60)
Glucose, Bld: 123 mg/dL — ABNORMAL HIGH (ref 70–99)
Potassium: 3.6 mmol/L (ref 3.5–5.1)
Sodium: 142 mmol/L (ref 135–145)
Total Bilirubin: <0.2 mg/dL — ABNORMAL LOW (ref 0.3–1.2)
Total Protein: 7.1 g/dL (ref 6.5–8.1)

## 2019-12-14 MED ORDER — SODIUM CHLORIDE 0.9 % IV SOLN
Freq: Once | INTRAVENOUS | Status: AC
  Filled 2019-12-14: qty 250

## 2019-12-14 MED ORDER — SODIUM CHLORIDE 0.9% FLUSH
10.0000 mL | INTRAVENOUS | Status: DC | PRN
  Administered 2019-12-14: 10 mL
  Filled 2019-12-14: qty 10

## 2019-12-14 MED ORDER — LORAZEPAM 2 MG/ML IJ SOLN
INTRAMUSCULAR | Status: AC
  Filled 2019-12-14: qty 1

## 2019-12-14 MED ORDER — SODIUM CHLORIDE 0.9 % IV SOLN
12.0000 mg | Freq: Once | INTRAVENOUS | Status: AC
  Administered 2019-12-14: 12 mg via INTRAVENOUS
  Filled 2019-12-14: qty 1.2

## 2019-12-14 MED ORDER — VINBLASTINE SULFATE CHEMO INJECTION 1 MG/ML
5.8000 mg/m2 | Freq: Once | INTRAVENOUS | Status: AC
  Administered 2019-12-14: 11 mg via INTRAVENOUS
  Filled 2019-12-14: qty 11

## 2019-12-14 MED ORDER — SODIUM CHLORIDE 0.9 % IV SOLN
150.0000 mg | Freq: Once | INTRAVENOUS | Status: AC
  Administered 2019-12-14: 150 mg via INTRAVENOUS
  Filled 2019-12-14: qty 150
  Filled 2019-12-14: qty 5

## 2019-12-14 MED ORDER — SODIUM CHLORIDE 0.9% FLUSH
10.0000 mL | INTRAVENOUS | Status: DC | PRN
  Filled 2019-12-14: qty 10

## 2019-12-14 MED ORDER — SODIUM CHLORIDE 0.9 % IV SOLN
16.0000 mg | Freq: Once | INTRAVENOUS | Status: AC
  Administered 2019-12-14: 16 mg via INTRAVENOUS
  Filled 2019-12-14: qty 8

## 2019-12-14 MED ORDER — SODIUM CHLORIDE 0.9 % IV SOLN
10.0000 [IU]/m2 | Freq: Once | INTRAVENOUS | Status: AC
  Administered 2019-12-14: 19 [IU] via INTRAVENOUS
  Filled 2019-12-14: qty 6.33

## 2019-12-14 MED ORDER — ZARXIO 480 MCG/0.8ML IJ SOSY: PREFILLED_SYRINGE | INTRAMUSCULAR | 2 refills | Status: DC

## 2019-12-14 MED ORDER — DOXORUBICIN HCL CHEMO IV INJECTION 2 MG/ML
25.0000 mg/m2 | Freq: Once | INTRAVENOUS | Status: AC
  Administered 2019-12-14: 48 mg via INTRAVENOUS
  Filled 2019-12-14: qty 24

## 2019-12-14 MED ORDER — HEPARIN SOD (PORK) LOCK FLUSH 100 UNIT/ML IV SOLN
500.0000 [IU] | Freq: Once | INTRAVENOUS | Status: AC | PRN
  Administered 2019-12-14: 500 [IU]
  Filled 2019-12-14: qty 5

## 2019-12-14 MED ORDER — LORAZEPAM 2 MG/ML IJ SOLN
0.5000 mg | Freq: Once | INTRAMUSCULAR | Status: AC
  Administered 2019-12-14: 0.5 mg via INTRAVENOUS

## 2019-12-14 MED ORDER — SODIUM CHLORIDE 0.9 % IV SOLN
375.0000 mg/m2 | Freq: Once | INTRAVENOUS | Status: AC
  Administered 2019-12-14: 710 mg via INTRAVENOUS
  Filled 2019-12-14: qty 71

## 2019-12-14 MED ORDER — ZARXIO 480 MCG/0.8ML IJ SOSY: PREFILLED_SYRINGE | INTRAMUSCULAR | 0 refills | Status: DC

## 2019-12-14 NOTE — Progress Notes (Signed)
I s/w Edison Nasuti, pharmacist at [facility] (he called to clarify Rx) re: Zarxio.  He confirmed frequency x 3 days w/ each tx.  I gave ok to refill x 2.  Kennith Center, Pharm.D., CPP 12/14/2019@3 :46 PM

## 2019-12-14 NOTE — Patient Instructions (Signed)
[facility] Discharge Instructions for Patients Receiving Chemotherapy  Today you received the following chemotherapy agents: Doxorubicin, Vinblastine, Bleomycin, and Dacarbazine  To help prevent nausea and vomiting after your treatment, we encourage you to take your nausea medication  as prescribed.    If you develop nausea and vomiting that is not controlled by your nausea medication, call the clinic.   BELOW ARE SYMPTOMS THAT SHOULD BE REPORTED IMMEDIATELY:  *FEVER GREATER THAN 100.5 F  *CHILLS WITH OR WITHOUT FEVER  NAUSEA AND VOMITING THAT IS NOT CONTROLLED WITH YOUR NAUSEA MEDICATION  *UNUSUAL SHORTNESS OF BREATH  *UNUSUAL BRUISING OR BLEEDING  TENDERNESS IN MOUTH AND THROAT WITH OR WITHOUT PRESENCE OF ULCERS  *URINARY PROBLEMS  *BOWEL PROBLEMS  UNUSUAL RASH Items with * indicate a potential emergency and should be followed up as soon as possible.  Feel free to call the clinic should you have any questions or concerns. The clinic phone number is [phone].  Please show the [place] at check-in to the Emergency Department and triage nurse.

## 2019-12-14 NOTE — Addendum Note (Signed)
Addended by: Tora Kindred on: 12/14/2019 04:09 PM   Modules accepted: Orders

## 2019-12-15 ENCOUNTER — Telehealth: Payer: Self-pay

## 2019-12-15 ENCOUNTER — Other Ambulatory Visit: Payer: Self-pay | Admitting: *Deleted

## 2019-12-15 ENCOUNTER — Telehealth: Payer: Self-pay | Admitting: *Deleted

## 2019-12-15 DIAGNOSIS — D702 Other drug-induced agranulocytosis: Secondary | ICD-10-CM

## 2019-12-15 DIAGNOSIS — C8118 Nodular sclerosis classical Hodgkin lymphoma, lymph nodes of multiple sites: Secondary | ICD-10-CM

## 2019-12-15 MED ORDER — ZARXIO 480 MCG/0.8ML IJ SOSY: PREFILLED_SYRINGE | INTRAMUSCULAR | 2 refills | Status: DC

## 2019-12-15 NOTE — Progress Notes (Signed)
Contacted by [facility] eligibility representative [place]. [phone]  Seeking an ICD 10 code for medication recently prescribed:filgrastim-sndz (ZARXIO) 480 MCG/0.8ML SOSY injection. Following information given to [place]. ICD 10 codes: C81.18  Nodular sclerosis Hodgkin lymphoma of lymph nodes of multiple regions ([place])  D70.2  Drug-induced neutropenia ([place])

## 2019-12-15 NOTE — Telephone Encounter (Signed)
Received Staff message from Elliot Gault regarding prescription for Zarxio : Received a vm from a RN, Sasha with [facility]. She is asking that the prescription for Zarxio to be faxed to them at [phone].  The phone number for Darrol Jump is [phone]. RX faxed to number as requested. Fax confirmation received.  Also contacted Mike Gip, RN Cancer Case Manager w/[employer], [phone], ext [phone] to provide information regarding medication to be administered at home for 3 days post chemo - starting 48 hours after chemo completed. Left voice mail for Ms. Juliane Lack and office contact number

## 2019-12-15 NOTE — Telephone Encounter (Signed)
Spoke with Dr. Irene Limbo regarding patient's question about having an occasional glass of wine with dinner. Per Dr. Irene Limbo, patient can have an occasional glass of wine. Patient aware and verbalizes understanding.

## 2019-12-17 ENCOUNTER — Telehealth: Payer: Self-pay | Admitting: *Deleted

## 2019-12-17 ENCOUNTER — Inpatient Hospital Stay: Payer: 59

## 2019-12-17 NOTE — Telephone Encounter (Signed)
Contacted by RN CM - Earnie Larsson Millan with [employer]. She was informed by pharmacy ([facility]) that they had not been able to reach patient to arrange delivery. She contacted patient to clarify and patient informed her she had talked with pharmacy this morning.  Contacted patient - patient said pharmacy did not want to arrange delivery when they spoke with her, they informed her that the medication was still being processed - not yet approved and delivery could not be scheduled.  [place] [phone]. Spoke with Lilia Pro, rep. Informed her that patient needed medication today.  Per Lilia Pro, medication was undergoing Urgent Request for review of eligibility and was not yet approved for patient.  She stated medication would be able to be shipped 24-48 hours from start of Urgent Request. She was not able to provide a timeline for delivery. Inquired as to how [facility] would set up a Home RN to provide injection. Lilia Pro conferred with supervisor and stated that [employer] and the MD office will need to arrange that, stating that this medication did not meet criteria for that service.

## 2019-12-17 NOTE — Telephone Encounter (Signed)
Contacted patient later this afternoon - she was contacted by pharmacy, medication will be sent to her tomorrow. She has experience with giving herself a [place] injection in the past and states she feels comfortable self administering this medication.

## 2019-12-21 ENCOUNTER — Encounter: Payer: Self-pay | Admitting: Hematology

## 2019-12-24 ENCOUNTER — Other Ambulatory Visit: Payer: 59

## 2019-12-24 ENCOUNTER — Ambulatory Visit: Payer: 59

## 2019-12-24 ENCOUNTER — Ambulatory Visit: Payer: 59 | Admitting: Hematology

## 2019-12-29 ENCOUNTER — Other Ambulatory Visit: Payer: Self-pay | Admitting: *Deleted

## 2019-12-29 DIAGNOSIS — C8118 Nodular sclerosis classical Hodgkin lymphoma, lymph nodes of multiple sites: Secondary | ICD-10-CM

## 2019-12-31 ENCOUNTER — Inpatient Hospital Stay: Payer: 59 | Attending: Hematology

## 2019-12-31 ENCOUNTER — Inpatient Hospital Stay: Payer: 59

## 2019-12-31 ENCOUNTER — Other Ambulatory Visit: Payer: Self-pay

## 2019-12-31 ENCOUNTER — Inpatient Hospital Stay: Payer: 59 | Admitting: Hematology

## 2019-12-31 VITALS — BP 131/79 | HR 79 | Temp 98.1°F | Resp 18 | Ht 70.0 in | Wt 148.2 lb

## 2019-12-31 DIAGNOSIS — C8111 Nodular sclerosis classical Hodgkin lymphoma, lymph nodes of head, face, and neck: Secondary | ICD-10-CM | POA: Diagnosis present

## 2019-12-31 DIAGNOSIS — C8118 Nodular sclerosis classical Hodgkin lymphoma, lymph nodes of multiple sites: Secondary | ICD-10-CM | POA: Diagnosis not present

## 2019-12-31 DIAGNOSIS — Z5111 Encounter for antineoplastic chemotherapy: Secondary | ICD-10-CM

## 2019-12-31 DIAGNOSIS — D630 Anemia in neoplastic disease: Secondary | ICD-10-CM | POA: Insufficient documentation

## 2019-12-31 DIAGNOSIS — Z7189 Other specified counseling: Secondary | ICD-10-CM

## 2019-12-31 DIAGNOSIS — Z23 Encounter for immunization: Secondary | ICD-10-CM | POA: Diagnosis not present

## 2019-12-31 DIAGNOSIS — Z95828 Presence of other vascular implants and grafts: Secondary | ICD-10-CM | POA: Diagnosis not present

## 2019-12-31 LAB — CBC WITH DIFFERENTIAL (CANCER CENTER ONLY)
Abs Immature Granulocytes: 0.01 10*3/uL (ref 0.00–0.07)
Basophils Absolute: 0.1 10*3/uL (ref 0.0–0.1)
Basophils Relative: 2 %
Eosinophils Absolute: 0.1 10*3/uL (ref 0.0–0.5)
Eosinophils Relative: 2 %
HCT: 35.5 % — ABNORMAL LOW (ref 36.0–46.0)
Hemoglobin: 11.8 g/dL — ABNORMAL LOW (ref 12.0–15.0)
Immature Granulocytes: 0 %
Lymphocytes Relative: 47 %
Lymphs Abs: 1.7 10*3/uL (ref 0.7–4.0)
MCH: 28.1 pg (ref 26.0–34.0)
MCHC: 33.2 g/dL (ref 30.0–36.0)
MCV: 84.5 fL (ref 80.0–100.0)
Monocytes Absolute: 0.3 10*3/uL (ref 0.1–1.0)
Monocytes Relative: 9 %
Neutro Abs: 1.4 10*3/uL — ABNORMAL LOW (ref 1.7–7.7)
Neutrophils Relative %: 40 %
Platelet Count: 368 10*3/uL (ref 150–400)
RBC: 4.2 MIL/uL (ref 3.87–5.11)
RDW: 14.2 % (ref 11.5–15.5)
WBC Count: 3.6 10*3/uL — ABNORMAL LOW (ref 4.0–10.5)
nRBC: 0 % (ref 0.0–0.2)

## 2019-12-31 LAB — CMP (CANCER CENTER ONLY)
ALT: 12 U/L (ref 0–44)
AST: 20 U/L (ref 15–41)
Albumin: 3.7 g/dL (ref 3.5–5.0)
Alkaline Phosphatase: 61 U/L (ref 38–126)
Anion gap: 6 (ref 5–15)
BUN: 8 mg/dL (ref 6–20)
CO2: 27 mmol/L (ref 22–32)
Calcium: 9.3 mg/dL (ref 8.9–10.3)
Chloride: 107 mmol/L (ref 98–111)
Creatinine: 0.78 mg/dL (ref 0.44–1.00)
GFR, Estimated: >60 mL/min (ref >60)
Glucose, Bld: 108 mg/dL — ABNORMAL HIGH (ref 70–99)
Potassium: 3.7 mmol/L (ref 3.5–5.1)
Sodium: 140 mmol/L (ref 135–145)
Total Bilirubin: 0.6 mg/dL (ref 0.3–1.2)
Total Protein: 6.5 g/dL (ref 6.5–8.1)

## 2019-12-31 MED ORDER — SODIUM CHLORIDE 0.9 % IV SOLN
375.0000 mg/m2 | Freq: Once | INTRAVENOUS | Status: AC
  Administered 2019-12-31: 710 mg via INTRAVENOUS
  Filled 2019-12-31: qty 71

## 2019-12-31 MED ORDER — HEPARIN SOD (PORK) LOCK FLUSH 100 UNIT/ML IV SOLN
500.0000 [IU] | Freq: Once | INTRAVENOUS | Status: AC | PRN
  Administered 2019-12-31: 500 [IU]
  Filled 2019-12-31: qty 5

## 2019-12-31 MED ORDER — LORAZEPAM 2 MG/ML IJ SOLN: 0.5000 mg | Freq: Once | INTRAMUSCULAR | Status: DC

## 2019-12-31 MED ORDER — SODIUM CHLORIDE 0.9 % IV SOLN
12.0000 mg | Freq: Once | INTRAVENOUS | Status: AC
  Administered 2019-12-31: 12 mg via INTRAVENOUS
  Filled 2019-12-31: qty 1.2

## 2019-12-31 MED ORDER — INFLUENZA VAC SPLIT QUAD 0.5 ML IM SUSY
0.5000 mL | PREFILLED_SYRINGE | Freq: Once | INTRAMUSCULAR | Status: AC
  Administered 2019-12-31: 0.5 mL via INTRAMUSCULAR

## 2019-12-31 MED ORDER — INFLUENZA VAC SPLIT QUAD 0.5 ML IM SUSY
PREFILLED_SYRINGE | INTRAMUSCULAR | Status: AC
  Filled 2019-12-31: qty 0.5

## 2019-12-31 MED ORDER — SODIUM CHLORIDE 0.9 % IV SOLN
10.0000 [IU]/m2 | Freq: Once | INTRAVENOUS | Status: AC
  Administered 2019-12-31: 19 [IU] via INTRAVENOUS
  Filled 2019-12-31: qty 6.33

## 2019-12-31 MED ORDER — VINBLASTINE SULFATE CHEMO INJECTION 1 MG/ML
11.0000 mg | Freq: Once | INTRAVENOUS | Status: AC
  Administered 2019-12-31: 11 mg via INTRAVENOUS
  Filled 2019-12-31: qty 11

## 2019-12-31 MED ORDER — SODIUM CHLORIDE 0.9% FLUSH
10.0000 mL | INTRAVENOUS | Status: DC | PRN
  Administered 2019-12-31: 10 mL
  Filled 2019-12-31: qty 10

## 2019-12-31 MED ORDER — DOXORUBICIN HCL CHEMO IV INJECTION 2 MG/ML
25.0000 mg/m2 | Freq: Once | INTRAVENOUS | Status: AC
  Administered 2019-12-31: 48 mg via INTRAVENOUS
  Filled 2019-12-31: qty 24

## 2019-12-31 MED ORDER — SODIUM CHLORIDE 0.9 % IV SOLN
Freq: Once | INTRAVENOUS | Status: AC
  Filled 2019-12-31: qty 250

## 2019-12-31 MED ORDER — SODIUM CHLORIDE 0.9 % IV SOLN
16.0000 mg | Freq: Once | INTRAVENOUS | Status: AC
  Administered 2019-12-31: 16 mg via INTRAVENOUS
  Filled 2019-12-31: qty 8

## 2019-12-31 MED ORDER — SODIUM CHLORIDE 0.9 % IV SOLN
150.0000 mg | Freq: Once | INTRAVENOUS | Status: AC
  Administered 2019-12-31: 150 mg via INTRAVENOUS
  Filled 2019-12-31: qty 150

## 2019-12-31 NOTE — Patient Instructions (Signed)
[facility] Discharge Instructions for Patients Receiving Chemotherapy  Today you received the following chemotherapy agents doxorubicin, vinbastine, bleomycin, dacarbine  To help prevent nausea and vomiting after your treatment, we encourage you to take your nausea medication as directed.    If you develop nausea and vomiting that is not controlled by your nausea medication, call the clinic.   BELOW ARE SYMPTOMS THAT SHOULD BE REPORTED IMMEDIATELY:  *FEVER GREATER THAN 100.5 F  *CHILLS WITH OR WITHOUT FEVER  NAUSEA AND VOMITING THAT IS NOT CONTROLLED WITH YOUR NAUSEA MEDICATION  *UNUSUAL SHORTNESS OF BREATH  *UNUSUAL BRUISING OR BLEEDING  TENDERNESS IN MOUTH AND THROAT WITH OR WITHOUT PRESENCE OF ULCERS  *URINARY PROBLEMS  *BOWEL PROBLEMS  UNUSUAL RASH Items with * indicate a potential emergency and should be followed up as soon as possible.  Feel free to call the clinic should you have any questions or concerns. The clinic phone number is [phone].  Please show the CHEMO ALERT CARD at check-in to the Emergency Department and triage nurse.   

## 2019-12-31 NOTE — Progress Notes (Signed)
HEMATOLOGY/ONCOLOGY CONSULTATION NOTE  Date of Service: 12/31/2019  Patient Care Team: Pcp, No as PCP - General  CHIEF COMPLAINTS/PURPOSE OF CONSULTATION:  Nodular sclerosis classical Hodgkin lymphoma  HISTORY OF PRESENTING ILLNESS:   Sonia Doyle is a wonderful 24 y.o. female who has been referred to [place] by Dr. Fonda Kinder for evaluation and management of newly diagnosed nodular sclerosis CHL. Pt is accompanied today by her mother. The pt reports that she is doing well overall.   The pt reports that she has asthma and has had a wisdom tooth extraction and tonsillectomy. She is only using her inhaler as needed at this time. She has no other known medical issues or medication allergies. 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 She has a family history significant for skin cancers.   Pt noticed an enlarged right-sided supraclavicular lymph node, which lead to the work up. She has since noticed more enlarged lymph nodes, but also a reduction in the size of the lymph node that initially concerned her. She had a fever in June that was accompanied by chills and night sweats. Pt denies weight loss, but has had multiple itchy rashes.   Pt has not had a port placed yet, as she had to cancel her previous appointment. Pt is scheduled for a consultation at [facility] later today. She has been set up for ovum harvesting in about 7-10 days for fertility preservation. She is currently on Menopur.   Prior to her referral pt received one IV Iron test dose, which caused her to be hoarse. They did not attempt any further IV Iron infusions. She was told to continue PO Iron.   08/24/2019 US Soft Tissue revealed "Biliteral supraclavicular nodules with atypical featues for lymph nodes, consider further evaluation with enhanced CT of the chest, abdomen and pelvis for the presence of additional adenopathy".   09/03/2019 CT Chest revealed  "Anterior mediastinal, bilateral hilar and neck adenopathy most suggestive of Lymphoma".   10/09/2019 Right Cervical Lymph Node Biopsy revealed "Classic Hodgkin's lymphoma, nodular sclerosis type".   10/09/2019 Flow Cytometry Report revealed "Negative for B-cell lymphoproliferative disorder or aberrant T-cell immunophenotype"  10/27/2019 ECHO was normal  11/03/2019 Left Iliac Bone BM Bx revealed  "-Normocellular to slightly hypercellular marrow with trilineage hematopoiesis and adequate magakaryocytes - Negative for lymphoma or other form of malignancy".  11/05/2019 PET/CT revealed  "Hypermetabolic lymphadeopathy involving multiple nodal stations in the neck and chest, consistent with reported history of lymphoma."   Most recent lab results (10/20/2019) of CBC w/diff & CMP is as follows: all values are WNL except for WBC at 14.8K, PLT at 588K, MPV at 6.8, Neutro Abs at 12.08K, Glucose at 68, Albumin at 3.0, ALP at 147. 10/20/2019 LDH at 279 09/15/2019 Sed Rate at 73  On review of systems, pt reports fevers, chills, night sweats, rash, new lumps/bumps and denies unexpected weight loss, constipation, abdominal pain and any other symptoms.   On PMHx the pt reports Asthma, Tonsillectomy, Wisdom Tooth Extraction. On Social Hx the pt reports that she is a non-smoker and does not drink much alcohol outside of social situations.  On Family Hx the pt reports skin cancer.   INTERVAL HISTORY:   Sonia Doyle is a wonderful 24 y.o. female who is here for evaluation and management of newly diagnosed nodular sclerosis CHL. She is here for after C2D1 of ABVD. The patient's last visit with [place] was on  12/10/2019. The pt reports that she is doing well overall.  The pt reports that after difficulty receiving her Granix at home, she has been able to self administer the injections. Pt continues to take PO Iron and a daily B-complex vitamin. She has received her COVID19 booster.   Lab results today (12/31/19)  of CBC w/diff and CMP is as follows: all values are WNL except for WBC at 3.6K, Hgb at 11.8, HCT at 35.5, Neutro Abs at 1.4K, Glucose at 108.  On review of systems, pt reports hair loss and denies mouth sores, rash, fevers, chills, night sweats, leg swelling, constipation, dysuria and any other symptoms.    MEDICAL HISTORY:  Childhood Asthma - not requiring preventative asthma medications/inhalers at present time   SURGICAL HISTORY: Tonsillectomy (2003) Wisdom Tooth Extraction    SOCIAL HISTORY: Social History   Socioeconomic History  . Marital status: Single    Spouse name: Not on file  . Number of children: Not on file  . Years of education: Not on file  . Highest education level: Not on file  Occupational History  . Not on file  Tobacco Use  . Smoking status: Never Smoker  . Smokeless tobacco: Never Used  Substance and Sexual Activity  . Alcohol use: Not on file  . Drug use: Not on file  . Sexual activity: Not on file  Other Topics Concern  . Not on file  Social History Narrative  . Not on file   Social Determinants of Health   Financial Resource Strain:   . Difficulty of Paying Living Expenses: Not on file  Food Insecurity:   . Worried About Charity fundraiser in the Last Year: Not on file  . Ran Out of Food in the Last Year: Not on file  Transportation Needs:   . Lack of Transportation (Medical): Not on file  . Lack of Transportation (Non-Medical): Not on file  Physical Activity:   . Days of Exercise per Week: Not on file  . Minutes of Exercise per Session: Not on file  Stress:   . Feeling of Stress : Not on file  Social Connections:   . Frequency of Communication with Friends and Family: Not on file  . Frequency of Social Gatherings with Friends and Family: Not on file  . Attends Religious Services: Not on file  . Active Member of Clubs or Organizations: Not on file  . Attends Archivist Meetings: Not on file  . Marital Status: Not on file    Intimate Partner Violence:   . Fear of Current or Ex-Partner: Not on file  . Emotionally Abused: Not on file  . Physically Abused: Not on file  . Sexually Abused: Not on file    FAMILY HISTORY: Hypothyroidism - Mother  Atrial fibrillation - Maternal Grandmother Diabetes type II - Paternal Grandmother  Dementia - Paternal Grandmother   ALLERGIES:  has No Known Allergies.  MEDICATIONS:  Current Outpatient Medications  Medication Sig Dispense Refill  . albuterol (VENTOLIN HFA) 108 (90 Base) MCG/ACT inhaler Inhale 1 puff into the lungs as needed for wheezing or shortness of breath. 1 each 0  . dexamethasone (DECADRON) 4 MG tablet Take 2 tablets by mouth once a day for 3 days after chemotherapy. Take with food. 30 tablet 1  . ferrous sulfate 325 (65 FE) MG tablet Take 325 mg by mouth daily with breakfast.    . filgrastim-sndz (ZARXIO) 480 MCG/0.8ML SOSY injection Home RN to administer 480 mcg of Zarxio [place]  daily for 3 days starting 24h after each cycle of chemotherapy 2.4 mL 2  . fluticasone (FLOVENT HFA) 110 MCG/ACT inhaler Inhale 1 puff into the lungs as needed. 1 each 0  . fluticasone (FLOVENT HFA) 44 MCG/ACT inhaler Inhale into the lungs.    . lidocaine-prilocaine (EMLA) cream Apply to affected area once 30 g 3  . loratadine (CLARITIN) 10 MG tablet Take by mouth.    Marland Kitchen LORazepam (ATIVAN) 0.5 MG tablet Take 1 tablet (0.5 mg total) by mouth every 6 (six) hours as needed (Nausea or vomiting). 30 tablet 0  . Norgestimate-Ethinyl Estradiol Triphasic 0.18/0.215/0.25 MG-25 MCG tab Take 1 tablet by mouth daily.    . ondansetron (ZOFRAN) 8 MG tablet Take 1 tablet (8 mg total) by mouth 2 (two) times daily as needed. Start on the third day after chemotherapy. 30 tablet 1  . prochlorperazine (COMPAZINE) 10 MG tablet Take 1 tablet (10 mg total) by mouth every 6 (six) hours as needed (Nausea or vomiting). 30 tablet 1  . traMADol (ULTRAM) 50 MG tablet Take 50-100 mg by mouth every 6 (six) hours as  needed.     No current facility-administered medications for this visit.    REVIEW OF SYSTEMS:   A 10+ POINT REVIEW OF SYSTEMS WAS OBTAINED including neurology, dermatology, psychiatry, cardiac, respiratory, lymph, extremities, GI, GU, Musculoskeletal, constitutional, breasts, reproductive, HEENT.  All pertinent positives are noted in the HPI.  All others are negative.   PHYSICAL EXAMINATION: ECOG PERFORMANCE STATUS: 1 - Symptomatic but completely ambulatory  . VS reviewed Exam was given in a chair   GENERAL:alert, in no acute distress and comfortable SKIN: no acute rashes, no significant lesions EYES: conjunctiva are pink and non-injected, sclera anicteric OROPHARYNX: MMM, no exudates, no oropharyngeal erythema or ulceration NECK: supple, no JVD LYMPH:  no palpable lymphadenopathy in the cervical, axillary or inguinal regions LUNGS: clear to auscultation b/l with normal respiratory effort HEART: regular rate & rhythm ABDOMEN:  normoactive bowel sounds , non tender, not distended. No palpable hepatosplenomegaly.  Extremity: no pedal edema PSYCH: alert & oriented x 3 with fluent speech NEURO: no focal motor/sensory deficits  LABORATORY DATA:  I have reviewed the data as listed  . CBC Latest Ref Rng & Units 12/31/2019 12/14/2019 12/10/2019  WBC 4.0 - 10.5 K/uL 3.6(L) 34.4(H) 2.7(L)  Hemoglobin 12.0 - 15.0 g/dL 11.8(L) 12.3 11.5(L)  Hematocrit 36 - 46 % 35.5(L) 37.8 35.1(L)  Platelets 150 - 400 K/uL 368 349 361   ANC 0.2k . CMP Latest Ref Rng & Units 12/31/2019 12/14/2019 12/10/2019  Glucose 70 - 99 mg/dL 108(H) 123(H) 81  BUN 6 - 20 mg/dL 8 8 9   Creatinine 0.44 - 1.00 mg/dL 0.78 0.87 0.72  Sodium 135 - 145 mmol/L 140 142 139  Potassium 3.5 - 5.1 mmol/L 3.7 3.6 4.2  Chloride 98 - 111 mmol/L 107 108 105  CO2 22 - 32 mmol/L 27 25 28   Calcium 8.9 - 10.3 mg/dL 9.3 9.3 9.4  Total Protein 6.5 - 8.1 g/dL 6.5 7.1 7.1  Total Bilirubin 0.3 - 1.2 mg/dL 0.6 <0.2(L) 0.3  Alkaline Phos  38 - 126 U/L 61 157(H) 80  AST 15 - 41 U/L 20 37 17  ALT 0 - 44 U/L 12 17 14      RADIOGRAPHIC STUDIES: I have personally reviewed the radiological images as listed and agreed with the findings in the report. No results found.  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  -11/05/2019 PET/CT revealed  "Hypermetabolic lymphadeopathy involving multiple nodal stations in the neck and chest, consistent with reported history of lymphoma."  2) Anemia of chronic disease. PLAN: -Discussed pt labwork today, 12/31/19; blood counts are steady, blood chemistries are nml.  -Advised pt that Vinblastine can cause neuropathy, but this is less typical in younger pts. Continue B-complex and PO Iron daily.  -Plan to discontinue Bleomycin after C2 if CR on PET - this may help stabilize counts and reduce the need for G-CSF support.  -The pt has no prohibitive toxicities from continuing C2D1 of ABVD at this time.  -Recommend pt continue salt/baking soda mouthwash 3-4x daily.  -Recommend pt continue 2000-3000 UT Vitamin D daily. -Will give flu vaccine today.  -Will repeat PET/CT prior to C3  -Will see back in 2 weeks with labs   FOLLOW UP: Plz schedule C2D15, C3D1 and C3D15 with portflush and labs MD visit with C2D15 and C3D1 PET/CT in 3 weeks   The total time spent in the appt was 30 minutes and more than 50% was on counseling and direct patient cares, ordering and management of chemotherapy  All of the patient's questions were answered with apparent satisfaction. The patient knows to call the clinic with any problems, questions or concerns.    Sullivan Lone MD [place] [facility] Hematology/Oncology Physician [facility]  (Office):       [phone] (Work cell):  [phone] (Fax):            [phone]  12/31/2019 10:47 AM   I, Yevette Edwards, am acting as a scribe for Dr. Sullivan Lone.   .I have reviewed the above documentation for accuracy and completeness, and I agree with the above. Brunetta Genera MD

## 2019-12-31 NOTE — Progress Notes (Signed)
Per verbal order from Dr. Irene Limbo, ok to treat with [place] 1.4.

## 2019-12-31 NOTE — Patient Instructions (Signed)

## 2020-01-04 ENCOUNTER — Encounter: Payer: Self-pay | Admitting: General Practice

## 2020-01-04 ENCOUNTER — Inpatient Hospital Stay: Payer: 59

## 2020-01-04 NOTE — Progress Notes (Signed)
Sonia Doyle attended the blood cancer support group on 10/12 and I followed up with a phone call today.  She had been proactive to reach out to our counseling intern, Charisse March, about resources for support.  She is also interested in connecting with other young people who are undergoing cancer treatment.  She found one group that is online, but she is hoping there may be someone else local. I will ask other support staff about this and get back to her.

## 2020-01-07 ENCOUNTER — Other Ambulatory Visit: Payer: 59

## 2020-01-07 ENCOUNTER — Ambulatory Visit: Payer: 59 | Admitting: Hematology

## 2020-01-07 ENCOUNTER — Ambulatory Visit: Payer: 59

## 2020-01-10 NOTE — Progress Notes (Signed)
HEMATOLOGY/ONCOLOGY CONSULTATION NOTE  Date of Service: 01/10/2020  Patient Care Team: Pcp, No as PCP - General  CHIEF COMPLAINTS/PURPOSE OF CONSULTATION:  Nodular sclerosis classical Hodgkin lymphoma  HISTORY OF PRESENTING ILLNESS:   Sonia Doyle is a wonderful 24 y.o. female who has been referred to [place] by Dr. Fonda Kinder for evaluation and management of newly diagnosed nodular sclerosis CHL. Pt is accompanied today by her mother. The pt reports that she is doing well overall.   The pt reports that she has asthma and has had a wisdom tooth extraction and tonsillectomy. She is only using her inhaler as needed at this time. She has no other known medical issues or medication allergies. 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 She has a family history significant for skin cancers.   Pt noticed an enlarged right-sided supraclavicular lymph node, which lead to the work up. She has since noticed more enlarged lymph nodes, but also a reduction in the size of the lymph node that initially concerned her. She had a fever in June that was accompanied by chills and night sweats. Pt denies weight loss, but has had multiple itchy rashes.   Pt has not had a port placed yet, as she had to cancel her previous appointment. Pt is scheduled for a consultation at [facility] later today. She has been set up for ovum harvesting in about 7-10 days for fertility preservation. She is currently on Menopur.   Prior to her referral pt received one IV Iron test dose, which caused her to be hoarse. They did not attempt any further IV Iron infusions. She was told to continue PO Iron.   08/24/2019 US Soft Tissue revealed "Biliteral supraclavicular nodules with atypical featues for lymph nodes, consider further evaluation with enhanced CT of the chest, abdomen and pelvis for the presence of additional adenopathy".   09/03/2019 CT Chest revealed  "Anterior mediastinal, bilateral hilar and neck adenopathy most suggestive of Lymphoma".   10/09/2019 Right Cervical Lymph Node Biopsy revealed "Classic Hodgkin's lymphoma, nodular sclerosis type".   10/09/2019 Flow Cytometry Report revealed "Negative for B-cell lymphoproliferative disorder or aberrant T-cell immunophenotype"  10/27/2019 ECHO was normal  11/03/2019 Left Iliac Bone BM Bx revealed  "-Normocellular to slightly hypercellular marrow with trilineage hematopoiesis and adequate magakaryocytes - Negative for lymphoma or other form of malignancy".  11/05/2019 PET/CT revealed  "Hypermetabolic lymphadeopathy involving multiple nodal stations in the neck and chest, consistent with reported history of lymphoma."   Most recent lab results (10/20/2019) of CBC w/diff & CMP is as follows: all values are WNL except for WBC at 14.8K, PLT at 588K, MPV at 6.8, Neutro Abs at 12.08K, Glucose at 68, Albumin at 3.0, ALP at 147. 10/20/2019 LDH at 279 09/15/2019 Sed Rate at 73  On review of systems, pt reports fevers, chills, night sweats, rash, new lumps/bumps and denies unexpected weight loss, constipation, abdominal pain and any other symptoms.   On PMHx the pt reports Asthma, Tonsillectomy, Wisdom Tooth Extraction. On Social Hx the pt reports that she is a non-smoker and does not drink much alcohol outside of social situations.  On Family Hx the pt reports skin cancer.   INTERVAL HISTORY:   Sonia Doyle is a wonderful 24 y.o. female who is here for evaluation and management of newly diagnosed nodular sclerosis CHL. She is here for after C2D15 of ABVD. The patient's last visit with [place] was on  12/31/2019. The pt reports that she is doing well overall.  The pt reports no acute new symptoms or prohibitive toxicities to treatment.  Notes some nail changes.  Minimal tingling in her toes which have resolved.  No new lumps or bumps. No uncontrolled nausea vomiting or pain. Did have some transiently  near raised rash on her upper extremities which resolved within 10 to 15 minutes.  No rash at the time of examination today.  Lab results today (01/10/20) of CBC w/diff and CMP reviewed with patient.  Chemistries are stable.  CBC shows some mild leukopenia of 2.9k with an ANC of 1000.  Mild anemia with a hemoglobin of 11.8.   On review of systems, pt reports no fevers no chills no night sweats.  MEDICAL HISTORY:  Childhood Asthma - not requiring preventative asthma medications/inhalers at present time   SURGICAL HISTORY: Tonsillectomy (2003) Wisdom Tooth Extraction    SOCIAL HISTORY: Social History   Socioeconomic History  . Marital status: Single    Spouse name: Not on file  . Number of children: Not on file  . Years of education: Not on file  . Highest education level: Not on file  Occupational History  . Not on file  Tobacco Use  . Smoking status: Never Smoker  . Smokeless tobacco: Never Used  Substance and Sexual Activity  . Alcohol use: Not on file  . Drug use: Not on file  . Sexual activity: Not on file  Other Topics Concern  . Not on file  Social History Narrative  . Not on file   Social Determinants of Health   Financial Resource Strain:   . Difficulty of Paying Living Expenses: Not on file  Food Insecurity:   . Worried About Charity fundraiser in the Last Year: Not on file  . Ran Out of Food in the Last Year: Not on file  Transportation Needs:   . Lack of Transportation (Medical): Not on file  . Lack of Transportation (Non-Medical): Not on file  Physical Activity:   . Days of Exercise per Week: Not on file  . Minutes of Exercise per Session: Not on file  Stress:   . Feeling of Stress : Not on file  Social Connections:   . Frequency of Communication with Friends and Family: Not on file  . Frequency of Social Gatherings with Friends and Family: Not on file  . Attends Religious Services: Not on file  . Active Member of Clubs or Organizations: Not on  file  . Attends Archivist Meetings: Not on file  . Marital Status: Not on file  Intimate Partner Violence:   . Fear of Current or Ex-Partner: Not on file  . Emotionally Abused: Not on file  . Physically Abused: Not on file  . Sexually Abused: Not on file    FAMILY HISTORY: Hypothyroidism - Mother  Atrial fibrillation - Maternal Grandmother Diabetes type II - Paternal Grandmother  Dementia - Paternal Grandmother   ALLERGIES:  has No Known Allergies.  MEDICATIONS:  Current Outpatient Medications  Medication Sig Dispense Refill  . albuterol (VENTOLIN HFA) 108 (90 Base) MCG/ACT inhaler Inhale 1 puff into the lungs as needed for wheezing or shortness of breath. 1 each 0  . dexamethasone (DECADRON) 4 MG tablet Take 2 tablets by mouth once a day for 3 days after chemotherapy. Take with food. 30 tablet 1  . ferrous sulfate 325 (65 FE) MG tablet Take 325 mg by mouth daily with breakfast.    .  filgrastim-sndz (ZARXIO) 480 MCG/0.8ML SOSY injection Home RN to administer 480 mcg of Zarxio Nikolski daily for 3 days starting 24h after each cycle of chemotherapy 2.4 mL 2  . fluticasone (FLOVENT HFA) 110 MCG/ACT inhaler Inhale 1 puff into the lungs as needed. 1 each 0  . fluticasone (FLOVENT HFA) 44 MCG/ACT inhaler Inhale into the lungs.    . lidocaine-prilocaine (EMLA) cream Apply to affected area once 30 g 3  . loratadine (CLARITIN) 10 MG tablet Take by mouth.    Marland Kitchen LORazepam (ATIVAN) 0.5 MG tablet Take 1 tablet (0.5 mg total) by mouth every 6 (six) hours as needed (Nausea or vomiting). 30 tablet 0  . Norgestimate-Ethinyl Estradiol Triphasic 0.18/0.215/0.25 MG-25 MCG tab Take 1 tablet by mouth daily.    . ondansetron (ZOFRAN) 8 MG tablet Take 1 tablet (8 mg total) by mouth 2 (two) times daily as needed. Start on the third day after chemotherapy. 30 tablet 1  . prochlorperazine (COMPAZINE) 10 MG tablet Take 1 tablet (10 mg total) by mouth every 6 (six) hours as needed (Nausea or vomiting). 30  tablet 1  . traMADol (ULTRAM) 50 MG tablet Take 50-100 mg by mouth every 6 (six) hours as needed.     No current facility-administered medications for this visit.    REVIEW OF SYSTEMS:   A 10+ POINT REVIEW OF SYSTEMS WAS OBTAINED including neurology, dermatology, psychiatry, cardiac, respiratory, lymph, extremities, GI, GU, Musculoskeletal, constitutional, breasts, reproductive, HEENT.  All pertinent positives are noted in the HPI.  All others are negative.   PHYSICAL EXAMINATION: ECOG PERFORMANCE STATUS: 1 - Symptomatic but completely ambulatory  .BP 134/81 (BP Location: Left Arm, Patient Position: Sitting)   Pulse 78   Temp 98.2 F (36.8 C) (Tympanic)   Resp 18   Ht 5\' 10"  (1.778 m)   Wt 146 lb 14.4 oz (66.6 kg)   LMP 01/06/2020 (Approximate)   SpO2 100%   BMI 21.08 kg/m    GENERAL:alert, in no acute distress and comfortable SKIN: no acute rashes, no significant lesions EYES: conjunctiva are pink and non-injected, sclera anicteric OROPHARYNX: MMM, no exudates, no oropharyngeal erythema or ulceration NECK: supple, no JVD LYMPH:  no palpable lymphadenopathy in the cervical, axillary or inguinal regions LUNGS: clear to auscultation b/l with normal respiratory effort HEART: regular rate & rhythm ABDOMEN:  normoactive bowel sounds , non tender, not distended. No palpable hepatosplenomegaly.  Extremity: no pedal edema PSYCH: alert & oriented x 3 with fluent speech NEURO: no focal motor/sensory deficits  LABORATORY DATA:  I have reviewed the data as listed  . CBC Latest Ref Rng & Units 01/14/2020 12/31/2019 12/14/2019  WBC 4.0 - 10.5 K/uL 2.9(L) 3.6(L) 34.4(H)  Hemoglobin 12.0 - 15.0 g/dL 11.8(L) 11.8(L) 12.3  Hematocrit 36 - 46 % 35.4(L) 35.5(L) 37.8  Platelets 150 - 400 K/uL 274 368 349   ANC 1000 . CMP Latest Ref Rng & Units 01/14/2020 12/31/2019 12/14/2019  Glucose 70 - 99 mg/dL 143(H) 108(H) 123(H)  BUN 6 - 20 mg/dL 10 8 8   Creatinine 0.44 - 1.00 mg/dL 0.83 0.78  0.87  Sodium 135 - 145 mmol/L 140 140 142  Potassium 3.5 - 5.1 mmol/L 3.6 3.7 3.6  Chloride 98 - 111 mmol/L 109 107 108  CO2 22 - 32 mmol/L 22 27 25   Calcium 8.9 - 10.3 mg/dL 9.2 9.3 9.3  Total Protein 6.5 - 8.1 g/dL 6.4(L) 6.5 7.1  Total Bilirubin 0.3 - 1.2 mg/dL 0.3 0.6 <0.2(L)  Alkaline Phos 38 - 126  U/L 62 61 157(H)  AST 15 - 41 U/L 14(L) 20 37  ALT 0 - 44 U/L 11 12 17      RADIOGRAPHIC STUDIES: I have personally reviewed the radiological images as listed and agreed with the findings in the report. No results found.  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  -11/05/2019 PET/CT revealed  "Hypermetabolic lymphadeopathy involving multiple nodal stations in the neck and chest, consistent with reported history of lymphoma."  2) Anemia of chronic disease. 3) chemotherapy related leukopenia/neutropenia PLAN: -CBC and CMP reviewed with the patient -Mild leukopenia/neutropenia with a WBC count of 2.9k with an ANC of 1000.   -No overt prohibitive toxicities from ABVD chemotherapy at this time.  We will proceed with cycle 2-day 15 with same doses of chemotherapy and same premedications. -She she will be getting 3 days of G-CSF at home starting 24 hours after chemotherapy. -Monitor for repeat skin rashes-as needed Benadryl.  Patient will call if this occurs. -Will repeat PET/CT prior to C3 in the next 1 week   FOLLOW UP: Follow-up for PET CT scan as scheduled Follow-up for cycle 3-day one and cycle 3-day 15 with port flush and labs MD visit in 2 weeks with cycle 3-day one   The total time spent in the appt was 20 minutes and more than 50% was on counseling and direct patient cares.  All of the patient's questions were answered with apparent satisfaction. The patient knows to call  the clinic with any problems, questions or concerns.    Sullivan Lone MD [place] AAHIVMS [facility] Hematology/Oncology Physician [facility]  (Office):       [phone] (Work cell):  [phone] (Fax):           [phone]  01/10/2020 8:21 PM   I, Yevette Edwards, am acting as a scribe for Dr. Sullivan Lone.   .I have reviewed the above documentation for accuracy and completeness, and I agree with the above. Brunetta Genera MD

## 2020-01-14 ENCOUNTER — Inpatient Hospital Stay: Payer: 59 | Admitting: Hematology

## 2020-01-14 ENCOUNTER — Other Ambulatory Visit: Payer: Self-pay

## 2020-01-14 ENCOUNTER — Inpatient Hospital Stay: Payer: 59

## 2020-01-14 VITALS — BP 134/81 | HR 78 | Temp 98.2°F | Resp 18 | Ht 70.0 in | Wt 146.9 lb

## 2020-01-14 DIAGNOSIS — C8118 Nodular sclerosis classical Hodgkin lymphoma, lymph nodes of multiple sites: Secondary | ICD-10-CM | POA: Diagnosis not present

## 2020-01-14 DIAGNOSIS — Z7189 Other specified counseling: Secondary | ICD-10-CM

## 2020-01-14 DIAGNOSIS — Z5111 Encounter for antineoplastic chemotherapy: Secondary | ICD-10-CM

## 2020-01-14 DIAGNOSIS — Z95828 Presence of other vascular implants and grafts: Secondary | ICD-10-CM

## 2020-01-14 LAB — CBC WITH DIFFERENTIAL/PLATELET
Abs Immature Granulocytes: 0.03 10*3/uL (ref 0.00–0.07)
Basophils Absolute: 0.1 10*3/uL (ref 0.0–0.1)
Basophils Relative: 2 %
Eosinophils Absolute: 0.1 10*3/uL (ref 0.0–0.5)
Eosinophils Relative: 2 %
HCT: 35.4 % — ABNORMAL LOW (ref 36.0–46.0)
Hemoglobin: 11.8 g/dL — ABNORMAL LOW (ref 12.0–15.0)
Immature Granulocytes: 1 %
Lymphocytes Relative: 52 %
Lymphs Abs: 1.5 10*3/uL (ref 0.7–4.0)
MCH: 28.1 pg (ref 26.0–34.0)
MCHC: 33.3 g/dL (ref 30.0–36.0)
MCV: 84.3 fL (ref 80.0–100.0)
Monocytes Absolute: 0.3 10*3/uL (ref 0.1–1.0)
Monocytes Relative: 9 %
Neutro Abs: 1 10*3/uL — ABNORMAL LOW (ref 1.7–7.7)
Neutrophils Relative %: 34 %
Platelets: 274 10*3/uL (ref 150–400)
RBC: 4.2 MIL/uL (ref 3.87–5.11)
RDW: 14.7 % (ref 11.5–15.5)
WBC: 2.9 10*3/uL — ABNORMAL LOW (ref 4.0–10.5)
nRBC: 0 % (ref 0.0–0.2)

## 2020-01-14 LAB — CMP (CANCER CENTER ONLY)
ALT: 11 U/L (ref 0–44)
AST: 14 U/L — ABNORMAL LOW (ref 15–41)
Albumin: 3.8 g/dL (ref 3.5–5.0)
Alkaline Phosphatase: 62 U/L (ref 38–126)
Anion gap: 9 (ref 5–15)
BUN: 10 mg/dL (ref 6–20)
CO2: 22 mmol/L (ref 22–32)
Calcium: 9.2 mg/dL (ref 8.9–10.3)
Chloride: 109 mmol/L (ref 98–111)
Creatinine: 0.83 mg/dL (ref 0.44–1.00)
GFR, Estimated: >60 mL/min (ref >60)
Glucose, Bld: 143 mg/dL — ABNORMAL HIGH (ref 70–99)
Potassium: 3.6 mmol/L (ref 3.5–5.1)
Sodium: 140 mmol/L (ref 135–145)
Total Bilirubin: 0.3 mg/dL (ref 0.3–1.2)
Total Protein: 6.4 g/dL — ABNORMAL LOW (ref 6.5–8.1)

## 2020-01-14 MED ORDER — SODIUM CHLORIDE 0.9 % IV SOLN
16.0000 mg | Freq: Once | INTRAVENOUS | Status: AC
  Administered 2020-01-14: 16 mg via INTRAVENOUS
  Filled 2020-01-14: qty 8

## 2020-01-14 MED ORDER — LORAZEPAM 2 MG/ML IJ SOLN
0.5000 mg | Freq: Once | INTRAMUSCULAR | Status: AC
  Administered 2020-01-14: 0.5 mg via INTRAVENOUS

## 2020-01-14 MED ORDER — SODIUM CHLORIDE 0.9 % IV SOLN
10.0000 [IU]/m2 | Freq: Once | INTRAVENOUS | Status: AC
  Administered 2020-01-14: 19 [IU] via INTRAVENOUS
  Filled 2020-01-14: qty 6.33

## 2020-01-14 MED ORDER — HEPARIN SOD (PORK) LOCK FLUSH 100 UNIT/ML IV SOLN
500.0000 [IU] | Freq: Once | INTRAVENOUS | Status: AC | PRN
  Administered 2020-01-14: 500 [IU]
  Filled 2020-01-14: qty 5

## 2020-01-14 MED ORDER — LORAZEPAM 2 MG/ML IJ SOLN
INTRAMUSCULAR | Status: AC
  Filled 2020-01-14: qty 1

## 2020-01-14 MED ORDER — SODIUM CHLORIDE 0.9 % IV SOLN
375.0000 mg/m2 | Freq: Once | INTRAVENOUS | Status: AC
  Administered 2020-01-14: 710 mg via INTRAVENOUS
  Filled 2020-01-14: qty 71

## 2020-01-14 MED ORDER — SODIUM CHLORIDE 0.9% FLUSH
10.0000 mL | INTRAVENOUS | Status: DC | PRN
  Administered 2020-01-14: 10 mL
  Filled 2020-01-14: qty 10

## 2020-01-14 MED ORDER — SODIUM CHLORIDE 0.9 % IV SOLN
150.0000 mg | Freq: Once | INTRAVENOUS | Status: AC
  Administered 2020-01-14: 150 mg via INTRAVENOUS
  Filled 2020-01-14: qty 150

## 2020-01-14 MED ORDER — VINBLASTINE SULFATE CHEMO INJECTION 1 MG/ML
5.8000 mg/m2 | Freq: Once | INTRAVENOUS | Status: AC
  Administered 2020-01-14: 11 mg via INTRAVENOUS
  Filled 2020-01-14: qty 11

## 2020-01-14 MED ORDER — SODIUM CHLORIDE 0.9 % IV SOLN
Freq: Once | INTRAVENOUS | Status: AC
  Filled 2020-01-14: qty 250

## 2020-01-14 MED ORDER — SODIUM CHLORIDE 0.9 % IV SOLN
12.0000 mg | Freq: Once | INTRAVENOUS | Status: AC
  Administered 2020-01-14: 12 mg via INTRAVENOUS
  Filled 2020-01-14: qty 1.2

## 2020-01-14 MED ORDER — DOXORUBICIN HCL CHEMO IV INJECTION 2 MG/ML
25.0000 mg/m2 | Freq: Once | INTRAVENOUS | Status: AC
  Administered 2020-01-14: 48 mg via INTRAVENOUS
  Filled 2020-01-14: qty 24

## 2020-01-14 NOTE — Patient Instructions (Signed)
[facility] Discharge Instructions for Patients Receiving Chemotherapy  Today you received the following chemotherapy agents doxorubicin, vinbastine, bleomycin, dacarbine  To help prevent nausea and vomiting after your treatment, we encourage you to take your nausea medication as directed.    If you develop nausea and vomiting that is not controlled by your nausea medication, call the clinic.   BELOW ARE SYMPTOMS THAT SHOULD BE REPORTED IMMEDIATELY:  *FEVER GREATER THAN 100.5 F  *CHILLS WITH OR WITHOUT FEVER  NAUSEA AND VOMITING THAT IS NOT CONTROLLED WITH YOUR NAUSEA MEDICATION  *UNUSUAL SHORTNESS OF BREATH  *UNUSUAL BRUISING OR BLEEDING  TENDERNESS IN MOUTH AND THROAT WITH OR WITHOUT PRESENCE OF ULCERS  *URINARY PROBLEMS  *BOWEL PROBLEMS  UNUSUAL RASH Items with * indicate a potential emergency and should be followed up as soon as possible.  Feel free to call the clinic should you have any questions or concerns. The clinic phone number is [phone].  Please show the [facility] at check-in to the Emergency Department and triage nurse.

## 2020-01-14 NOTE — Progress Notes (Signed)
Per Dr. Irene Limbo, ok to treat with ANC 1.0.

## 2020-01-18 ENCOUNTER — Ambulatory Visit: Payer: 59

## 2020-01-20 ENCOUNTER — Other Ambulatory Visit: Payer: Self-pay

## 2020-01-20 ENCOUNTER — Ambulatory Visit (HOSPITAL_COMMUNITY)
Admission: RE | Admit: 2020-01-20 | Discharge: 2020-01-20 | Disposition: A | Discharge status: Home / Self Care | Payer: 59 | Source: Ambulatory Visit | Attending: Hematology | Admitting: Hematology

## 2020-01-20 DIAGNOSIS — C8118 Nodular sclerosis classical Hodgkin lymphoma, lymph nodes of multiple sites: Secondary | ICD-10-CM

## 2020-01-20 LAB — GLUCOSE, CAPILLARY: Glucose-Capillary: 88 mg/dL (ref 70–99)

## 2020-01-20 MED ORDER — FLUDEOXYGLUCOSE F - 18 (FDG) INJECTION: 7.3000 | Freq: Once | INTRAVENOUS | Status: DC | PRN

## 2020-01-22 ENCOUNTER — Telehealth: Payer: Self-pay | Admitting: Hematology

## 2020-01-22 NOTE — Telephone Encounter (Signed)
Scheduled per 10/28 los, patient has been called and notified of upcoming appointments. 

## 2020-01-26 NOTE — Progress Notes (Signed)
[facility] Patient and [facility] Counseling Note   Session was an intake session, so it began with counselor explaining PDS and getting informed consent. Then patient filled out intake form and counselor discussed with patient. The patient brought up concerns around her recent PET scan, her diagnosis in general, her family, the "in between" stage she has found herself in, and wanted coping strategies. Counselor provided empathic, reflective listening and collaborated with patient to start making a plan. Patient presented to session oriented times three and showed no signs of SI/HI/NSSI. Her mood was cheerful and friendly, but moved to more tearful and sad during emotional topics. Her affect was appropriate to context and patient cried during parts of conversation. The patient has had a diagnosis most people her age do not and thinks she should not have had this disease and never even really felt sick. The thoughts from her situation have made her feel overwhelmed and stressed. Combine this stress with the stress of applying to medical school and the patient is in some distress. She has good coping skills, and can use those to help her adjust to her life now. Next session will continue to assess for maladaptive thought patterns and provide techniques to help overcome insomnia.   Gaylyn Rong Counseling Intern

## 2020-01-28 ENCOUNTER — Ambulatory Visit: Payer: 59

## 2020-01-29 ENCOUNTER — Inpatient Hospital Stay: Payer: 59

## 2020-01-29 ENCOUNTER — Inpatient Hospital Stay (HOSPITAL_BASED_OUTPATIENT_CLINIC_OR_DEPARTMENT_OTHER): Payer: 59 | Admitting: Hematology

## 2020-01-29 ENCOUNTER — Other Ambulatory Visit: Payer: Self-pay

## 2020-01-29 ENCOUNTER — Inpatient Hospital Stay: Payer: 59 | Attending: Hematology

## 2020-01-29 VITALS — BP 122/89 | HR 85 | Temp 99.4°F | Resp 18 | Ht 70.0 in | Wt 145.9 lb

## 2020-01-29 DIAGNOSIS — C8118 Nodular sclerosis classical Hodgkin lymphoma, lymph nodes of multiple sites: Secondary | ICD-10-CM

## 2020-01-29 DIAGNOSIS — Z5111 Encounter for antineoplastic chemotherapy: Secondary | ICD-10-CM

## 2020-01-29 DIAGNOSIS — C8111 Nodular sclerosis classical Hodgkin lymphoma, lymph nodes of head, face, and neck: Secondary | ICD-10-CM | POA: Diagnosis present

## 2020-01-29 DIAGNOSIS — Z7189 Other specified counseling: Secondary | ICD-10-CM

## 2020-01-29 DIAGNOSIS — Z95828 Presence of other vascular implants and grafts: Secondary | ICD-10-CM

## 2020-01-29 DIAGNOSIS — D630 Anemia in neoplastic disease: Secondary | ICD-10-CM | POA: Insufficient documentation

## 2020-01-29 LAB — CMP (CANCER CENTER ONLY)
ALT: 12 U/L (ref 0–44)
AST: 16 U/L (ref 15–41)
Albumin: 4 g/dL (ref 3.5–5.0)
Alkaline Phosphatase: 56 U/L (ref 38–126)
Anion gap: 7 (ref 5–15)
BUN: 12 mg/dL (ref 6–20)
CO2: 26 mmol/L (ref 22–32)
Calcium: 9.6 mg/dL (ref 8.9–10.3)
Chloride: 106 mmol/L (ref 98–111)
Creatinine: 0.75 mg/dL (ref 0.44–1.00)
GFR, Estimated: >60 mL/min
Glucose, Bld: 88 mg/dL (ref 70–99)
Potassium: 4 mmol/L (ref 3.5–5.1)
Sodium: 139 mmol/L (ref 135–145)
Total Bilirubin: 0.5 mg/dL (ref 0.3–1.2)
Total Protein: 6.8 g/dL (ref 6.5–8.1)

## 2020-01-29 LAB — CBC WITH DIFFERENTIAL/PLATELET
Abs Immature Granulocytes: 0.02 10*3/uL (ref 0.00–0.07)
Basophils Absolute: 0.1 10*3/uL (ref 0.0–0.1)
Basophils Relative: 1 %
Eosinophils Absolute: 0.1 10*3/uL (ref 0.0–0.5)
Eosinophils Relative: 1 %
HCT: 36.6 % (ref 36.0–46.0)
Hemoglobin: 12.5 g/dL (ref 12.0–15.0)
Immature Granulocytes: 1 %
Lymphocytes Relative: 40 %
Lymphs Abs: 1.7 10*3/uL (ref 0.7–4.0)
MCH: 28.6 pg (ref 26.0–34.0)
MCHC: 34.2 g/dL (ref 30.0–36.0)
MCV: 83.8 fL (ref 80.0–100.0)
Monocytes Absolute: 0.5 10*3/uL (ref 0.1–1.0)
Monocytes Relative: 13 %
Neutro Abs: 1.9 10*3/uL (ref 1.7–7.7)
Neutrophils Relative %: 44 %
Platelets: 308 10*3/uL (ref 150–400)
RBC: 4.37 MIL/uL (ref 3.87–5.11)
RDW: 14.8 % (ref 11.5–15.5)
WBC: 4.2 10*3/uL (ref 4.0–10.5)
nRBC: 0 % (ref 0.0–0.2)

## 2020-01-29 MED ORDER — VINBLASTINE SULFATE CHEMO INJECTION 1 MG/ML
5.8000 mg/m2 | Freq: Once | INTRAVENOUS | Status: AC
  Administered 2020-01-29: 11 mg via INTRAVENOUS
  Filled 2020-01-29: qty 11

## 2020-01-29 MED ORDER — DOXORUBICIN HCL CHEMO IV INJECTION 2 MG/ML
25.0000 mg/m2 | Freq: Once | INTRAVENOUS | Status: AC
  Administered 2020-01-29: 48 mg via INTRAVENOUS
  Filled 2020-01-29: qty 24

## 2020-01-29 MED ORDER — SODIUM CHLORIDE 0.9 % IV SOLN
12.0000 mg | Freq: Once | INTRAVENOUS | Status: AC
  Administered 2020-01-29: 12 mg via INTRAVENOUS
  Filled 2020-01-29: qty 1.2

## 2020-01-29 MED ORDER — SODIUM CHLORIDE 0.9% FLUSH
10.0000 mL | INTRAVENOUS | Status: DC | PRN
  Administered 2020-01-29: 10 mL
  Filled 2020-01-29: qty 10

## 2020-01-29 MED ORDER — HEPARIN SOD (PORK) LOCK FLUSH 100 UNIT/ML IV SOLN
500.0000 [IU] | Freq: Once | INTRAVENOUS | Status: AC | PRN
  Administered 2020-01-29: 500 [IU]
  Filled 2020-01-29: qty 5

## 2020-01-29 MED ORDER — SODIUM CHLORIDE 0.9 % IV SOLN
16.0000 mg | Freq: Once | INTRAVENOUS | Status: AC
  Administered 2020-01-29: 16 mg via INTRAVENOUS
  Filled 2020-01-29: qty 8

## 2020-01-29 MED ORDER — SODIUM CHLORIDE 0.9 % IV SOLN
375.0000 mg/m2 | Freq: Once | INTRAVENOUS | Status: AC
  Administered 2020-01-29: 710 mg via INTRAVENOUS
  Filled 2020-01-29: qty 71

## 2020-01-29 MED ORDER — SODIUM CHLORIDE 0.9 % IV SOLN
150.0000 mg | Freq: Once | INTRAVENOUS | Status: AC
  Administered 2020-01-29: 150 mg via INTRAVENOUS
  Filled 2020-01-29: qty 150

## 2020-01-29 MED ORDER — SODIUM CHLORIDE 0.9 % IV SOLN
Freq: Once | INTRAVENOUS | Status: AC
  Filled 2020-01-29: qty 250

## 2020-01-29 NOTE — Progress Notes (Signed)
HEMATOLOGY/ONCOLOGY CONSULTATION NOTE  Date of Service: 01/29/2020  Patient Care Team: Pcp, No as PCP - General  CHIEF COMPLAINTS/PURPOSE OF CONSULTATION:  Nodular sclerosis classical Hodgkin lymphoma  HISTORY OF PRESENTING ILLNESS:   Sonia Doyle is a wonderful 24 y.o. female who has been referred to [place] by Dr. Fonda Kinder for evaluation and management of newly diagnosed nodular sclerosis CHL. Pt is accompanied today by her mother. The pt reports that she is doing well overall.   The pt reports that she has asthma and has had a wisdom tooth extraction and tonsillectomy. She is only using her inhaler as needed at this time. She has no other known medical issues or medication allergies. 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 She has a family history significant for skin cancers.   Pt noticed an enlarged right-sided supraclavicular lymph node, which lead to the work up. She has since noticed more enlarged lymph nodes, but also a reduction in the size of the lymph node that initially concerned her. She had a fever in June that was accompanied by chills and night sweats. Pt denies weight loss, but has had multiple itchy rashes.   Pt has not had a port placed yet, as she had to cancel her previous appointment. Pt is scheduled for a consultation at [facility] later today. She has been set up for ovum harvesting in about 7-10 days for fertility preservation. She is currently on Menopur.   Prior to her referral pt received one IV Iron test dose, which caused her to be hoarse. They did not attempt any further IV Iron infusions. She was told to continue PO Iron.   08/24/2019 US Soft Tissue revealed "Biliteral supraclavicular nodules with atypical featues for lymph nodes, consider further evaluation with enhanced CT of the chest, abdomen and pelvis for the presence of additional adenopathy".   09/03/2019 CT Chest revealed  "Anterior mediastinal, bilateral hilar and neck adenopathy most suggestive of Lymphoma".   10/09/2019 Right Cervical Lymph Node Biopsy revealed "Classic Hodgkin's lymphoma, nodular sclerosis type".   10/09/2019 Flow Cytometry Report revealed "Negative for B-cell lymphoproliferative disorder or aberrant T-cell immunophenotype"  10/27/2019 ECHO was normal  11/03/2019 Left Iliac Bone BM Bx revealed  "-Normocellular to slightly hypercellular marrow with trilineage hematopoiesis and adequate magakaryocytes - Negative for lymphoma or other form of malignancy".  11/05/2019 PET/CT revealed  "Hypermetabolic lymphadeopathy involving multiple nodal stations in the neck and chest, consistent with reported history of lymphoma."   Most recent lab results (10/20/2019) of CBC w/diff & CMP is as follows: all values are WNL except for WBC at 14.8K, PLT at 588K, MPV at 6.8, Neutro Abs at 12.08K, Glucose at 68, Albumin at 3.0, ALP at 147. 10/20/2019 LDH at 279 09/15/2019 Sed Rate at 73  On review of systems, pt reports fevers, chills, night sweats, rash, new lumps/bumps and denies unexpected weight loss, constipation, abdominal pain and any other symptoms.   On PMHx the pt reports Asthma, Tonsillectomy, Wisdom Tooth Extraction. On Social Hx the pt reports that she is a non-smoker and does not drink much alcohol outside of social situations.  On Family Hx the pt reports skin cancer.   INTERVAL HISTORY:  Sonia Doyle is a wonderful 24 y.o. female who is here for evaluation and management Nodular Sclerosing CHL. She is here for C3D1 of AVD. The patient's last visit with [place] was on 01/14/2020. The pt reports that  she is doing well overall.  The pt reports that she is experiencing recurrent, whip-like rashes that last for about an hour. She has noticed a small lump arising near her port. Pt has constipation for 2-3 days following treatment.   Of note since the patient's last visit, pt has had PET/CT ([id number])  completed on 01/20/2020 with results revealing "1. No findings of pathologically enlarged or hypermetabolic adenopathy 2. Diffuse accentuated skeletal activity, most commonly seen in the setting of granulocyte stimulation. 3. Hypermetabolic brown fat in the neck, upper shoulders, and upper mediastinum."  Lab results today (01/29/20) of CBC w/diff and CMP is as follows: all values are WNL.  On review of systems, pt reports anxiety, occasional constipation and denies tingling/numbness in hands/feet, sleeplessness, leg swelling, diarrhea and any other symptoms.    MEDICAL HISTORY:  Childhood Asthma - not requiring preventative asthma medications/inhalers at present time   SURGICAL HISTORY: Tonsillectomy (2003) Wisdom Tooth Extraction    SOCIAL HISTORY: Social History   Socioeconomic History  . Marital status: Single    Spouse name: Not on file  . Number of children: Not on file  . Years of education: Not on file  . Highest education level: Not on file  Occupational History  . Not on file  Tobacco Use  . Smoking status: Never Smoker  . Smokeless tobacco: Never Used  Substance and Sexual Activity  . Alcohol use: Not on file  . Drug use: Not on file  . Sexual activity: Not on file  Other Topics Concern  . Not on file  Social History Narrative  . Not on file   Social Determinants of Health   Financial Resource Strain:   . Difficulty of Paying Living Expenses: Not on file  Food Insecurity:   . Worried About Charity fundraiser in the Last Year: Not on file  . Ran Out of Food in the Last Year: Not on file  Transportation Needs:   . Lack of Transportation (Medical): Not on file  . Lack of Transportation (Non-Medical): Not on file  Physical Activity:   . Days of Exercise per Week: Not on file  . Minutes of Exercise per Session: Not on file  Stress:   . Feeling of Stress : Not on file  Social Connections:   . Frequency of Communication with Friends and Family: Not on file   . Frequency of Social Gatherings with Friends and Family: Not on file  . Attends Religious Services: Not on file  . Active Member of Clubs or Organizations: Not on file  . Attends Archivist Meetings: Not on file  . Marital Status: Not on file  Intimate Partner Violence:   . Fear of Current or Ex-Partner: Not on file  . Emotionally Abused: Not on file  . Physically Abused: Not on file  . Sexually Abused: Not on file    FAMILY HISTORY: Hypothyroidism - Mother  Atrial fibrillation - Maternal Grandmother Diabetes type II - Paternal Grandmother  Dementia - Paternal Grandmother   ALLERGIES:  has No Known Allergies.  MEDICATIONS:  Current Outpatient Medications  Medication Sig Dispense Refill  . albuterol (VENTOLIN HFA) 108 (90 Base) MCG/ACT inhaler Inhale 1 puff into the lungs as needed for wheezing or shortness of breath. 1 each 0  . dexamethasone (DECADRON) 4 MG tablet Take 2 tablets by mouth once a day for 3 days after chemotherapy. Take with food. 30 tablet 1  . ferrous sulfate 325 (65 FE) MG tablet Take 325 mg  by mouth daily with breakfast.    . filgrastim-sndz (ZARXIO) 480 MCG/0.8ML SOSY injection Home RN to administer 480 mcg of Zarxio [place] daily for 3 days starting 24h after each cycle of chemotherapy 2.4 mL 2  . fluticasone (FLOVENT HFA) 110 MCG/ACT inhaler Inhale 1 puff into the lungs as needed. 1 each 0  . fluticasone (FLOVENT HFA) 44 MCG/ACT inhaler Inhale into the lungs.    . lidocaine-prilocaine (EMLA) cream Apply to affected area once 30 g 3  . loratadine (CLARITIN) 10 MG tablet Take by mouth.    Marland Kitchen LORazepam (ATIVAN) 0.5 MG tablet Take 1 tablet (0.5 mg total) by mouth every 6 (six) hours as needed (Nausea or vomiting). 30 tablet 0  . Norgestimate-Ethinyl Estradiol Triphasic 0.18/0.215/0.25 MG-25 MCG tab Take 1 tablet by mouth daily.    . ondansetron (ZOFRAN) 8 MG tablet Take 1 tablet (8 mg total) by mouth 2 (two) times daily as needed. Start on the third day after  chemotherapy. 30 tablet 1  . prochlorperazine (COMPAZINE) 10 MG tablet Take 1 tablet (10 mg total) by mouth every 6 (six) hours as needed (Nausea or vomiting). 30 tablet 1  . traMADol (ULTRAM) 50 MG tablet Take 50-100 mg by mouth every 6 (six) hours as needed.     No current facility-administered medications for this visit.    REVIEW OF SYSTEMS:   A 10+ POINT REVIEW OF SYSTEMS WAS OBTAINED including neurology, dermatology, psychiatry, cardiac, respiratory, lymph, extremities, GI, GU, Musculoskeletal, constitutional, breasts, reproductive, HEENT.  All pertinent positives are noted in the HPI.  All others are negative.   PHYSICAL EXAMINATION: ECOG PERFORMANCE STATUS: 1 - Symptomatic but completely ambulatory  .BP 122/89 (BP Location: Left Arm, Patient Position: Sitting)   Pulse 85   Temp 99.4 F (37.4 C) (Tympanic)   Resp 18   Ht 5\' 10"  (1.778 m)   Wt 145 lb 14.4 oz (66.2 kg)   LMP 01/06/2020 (Approximate)   SpO2 100%   BMI 20.93 kg/m    GENERAL:alert, in no acute distress and comfortable SKIN: no acute rashes, no significant lesions EYES: conjunctiva are pink and non-injected, sclera anicteric OROPHARYNX: MMM, no exudates, no oropharyngeal erythema or ulceration NECK: supple, no JVD LYMPH:  no palpable lymphadenopathy in the cervical, axillary or inguinal regions LUNGS: clear to auscultation b/l with normal respiratory effort HEART: regular rate & rhythm ABDOMEN:  normoactive bowel sounds , non tender, not distended. No palpable hepatosplenomegaly.  Extremity: no pedal edema PSYCH: alert & oriented x 3 with fluent speech NEURO: no focal motor/sensory deficits  LABORATORY DATA:  I have reviewed the data as listed  . CBC Latest Ref Rng & Units 01/29/2020 01/14/2020 12/31/2019  WBC 4.0 - 10.5 K/uL 4.2 2.9(L) 3.6(L)  Hemoglobin 12.0 - 15.0 g/dL 12.5 11.8(L) 11.8(L)  Hematocrit 36 - 46 % 36.6 35.4(L) 35.5(L)  Platelets 150 - 400 K/uL 308 274 368   ANC 1000 . CMP Latest Ref  Rng & Units 01/29/2020 01/14/2020 12/31/2019  Glucose 70 - 99 mg/dL 88 143(H) 108(H)  BUN 6 - 20 mg/dL 12 10 8   Creatinine 0.44 - 1.00 mg/dL 0.75 0.83 0.78  Sodium 135 - 145 mmol/L 139 140 140  Potassium 3.5 - 5.1 mmol/L 4.0 3.6 3.7  Chloride 98 - 111 mmol/L 106 109 107  CO2 22 - 32 mmol/L 26 22 27   Calcium 8.9 - 10.3 mg/dL 9.6 9.2 9.3  Total Protein 6.5 - 8.1 g/dL 6.8 6.4(L) 6.5  Total Bilirubin 0.3 - 1.2 mg/dL  0.5 0.3 0.6  Alkaline Phos 38 - 126 U/L 56 62 61  AST 15 - 41 U/L 16 14(L) 20  ALT 0 - 44 U/L 12 11 12      RADIOGRAPHIC STUDIES: I have personally reviewed the radiological images as listed and agreed with the findings in the report. NM PET Image Restag (PS) Skull Base To Thigh  Result Date: 01/20/2020 CLINICAL DATA:  Subsequent treatment strategy for Hodgkin lymphoma. EXAM: NUCLEAR MEDICINE PET SKULL BASE TO THIGH TECHNIQUE: 7.3 mCi F-18 FDG was injected intravenously. Full-ring PET imaging was performed from the skull base to thigh after the radiotracer. CT data was obtained and used for attenuation correction and anatomic localization. Fasting blood glucose: 88 mg/dl COMPARISON:  None. FINDINGS: Mediastinal blood pool activity: SUV max 1.9 Liver activity: SUV max 3.0 NECK: Hypermetabolic brown fat in the neck extending towards the shoulders. No pathologically enlarged or discernibly hypermetabolic adenopathy in the neck. Incidental CT findings: None CHEST: Hypermetabolic brown fat in the upper mediastinum and to a lesser extent in the paraspinal regions. No current pathologically enlarged or appreciably hypermetabolic adenopathy in the chest. Anterior mediastinal soft tissue density with maximum SUV around 2.0, favoring residual thymic tissue. This does not have a masslike configuration. Incidental CT findings: Right Port-A-Cath tip: Cavoatrial junction. ABDOMEN/PELVIS: No significant abnormal hypermetabolic activity in this region. No splenomegaly. Incidental CT findings:  Unremarkable SKELETON: Diffuse accentuated activity throughout the skeleton, most commonly encountered in setting of granulocyte stimulation. This has symmetric distribution. Incidental CT findings: none IMPRESSION: 1. No findings of pathologically enlarged or hypermetabolic adenopathy. 2. Diffuse accentuated skeletal activity, most commonly seen in the setting of granulocyte stimulation. 3. Hypermetabolic brown fat in the neck, upper shoulders, and upper mediastinum. Electronically Signed   By: Van Clines M.D.   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  -11/05/2019 PET/CT revealed  "Hypermetabolic lymphadeopathy involving multiple nodal stations in the neck and chest, consistent with reported history of lymphoma."  2) Anemia of chronic disease. 3) chemotherapy related leukopenia/neutropenia PLAN: -Discussed pt labwork today, 01/29/20; blood counts and chemistries are nml.  -Discussed 01/20/2020 PET/CT ([id number]) which revealed "1. No findings of pathologically enlarged or hypermetabolic adenopathy." -The pt has no overt prohibitive toxicities from continuing C3D1 AVD chemotherapy at this time. We will proceed with C3D15 with same doses of chemotherapy and same premedications. Will hold Bleomycin for the duration of treatment. -Advised pt that standard of care treatments for Nodular Sclerosing Hodgkin's lymphoma aims to prevent recurrence.  -Advised pt that the appearance of the rash suggests that it is caused by Bleomycin. This should resolve as we are dropping Bleomycin.  -Advised pt that if the rashes continue after holding Bleomycin would recommend an OTC antihistamine like Claritin or Benadryl.  -Will continue 3 days of G-CSF at home starting 24 hours after  chemotherapy. Plan to hold for C4 if counts remain stable. -Recommend OTC antibiotic ointment  -Will see back in 4 weeks with labs   FOLLOW UP: -Plz schedule C3D15 of AVD chemotherapy with portflush and labs -Plz C4D1 of AVD chemotherapy with portflush, labs and MD visit -Plz schedule C4D15 of chemotherapy with portflush and labs   The total time spent in the appt was 30 minutes and more than 50% was on counseling and direct patient cares,  ordering and mx of chemotherapy , discussion of PET/CT   All of the patient's questions were answered with apparent satisfaction. The patient knows to call the clinic with any problems, questions or concerns.    Sullivan Lone MD [place] AAHIVMS [facility] Hematology/Oncology Physician [facility]  (Office):       [phone] (Work cell):  [phone] (Fax):           [phone]  01/29/2020 1:18 PM   I, Yevette Edwards, am acting as a scribe for Dr. Sullivan Lone.   .I have reviewed the above documentation for accuracy and completeness, and I agree with the above. Brunetta Genera MD

## 2020-01-29 NOTE — Patient Instructions (Signed)
[place] Discharge Instructions for Patients Receiving Chemotherapy  Today you received the following chemotherapy agents Doxorubicin (ADRIAMYCIN), Vinblastine (VELBAN), Bleomycin (BLEOCIN) & Dacarbazine (DTIC).  To help prevent nausea and vomiting after your treatment, we encourage you to take your nausea medication as prescribed.   If you develop nausea and vomiting that is not controlled by your nausea medication, call the clinic.   BELOW ARE SYMPTOMS THAT SHOULD BE REPORTED IMMEDIATELY:  *FEVER GREATER THAN 100.5 F  *CHILLS WITH OR WITHOUT FEVER  NAUSEA AND VOMITING THAT IS NOT CONTROLLED WITH YOUR NAUSEA MEDICATION  *UNUSUAL SHORTNESS OF BREATH  *UNUSUAL BRUISING OR BLEEDING  TENDERNESS IN MOUTH AND THROAT WITH OR WITHOUT PRESENCE OF ULCERS  *URINARY PROBLEMS  *BOWEL PROBLEMS  UNUSUAL RASH Items with * indicate a potential emergency and should be followed up as soon as possible.  Feel free to call the clinic should you have any questions or concerns. The clinic phone number is [phone].  Please show the [place] at check-in to the Emergency Department and triage nurse.

## 2020-02-01 ENCOUNTER — Other Ambulatory Visit: Payer: Self-pay | Admitting: *Deleted

## 2020-02-01 DIAGNOSIS — C8118 Nodular sclerosis classical Hodgkin lymphoma, lymph nodes of multiple sites: Secondary | ICD-10-CM

## 2020-02-01 DIAGNOSIS — D702 Other drug-induced agranulocytosis: Secondary | ICD-10-CM

## 2020-02-01 MED ORDER — ZARXIO 480 MCG/0.8ML IJ SOSY: PREFILLED_SYRINGE | INTRAMUSCULAR | 2 refills | Status: DC

## 2020-02-02 NOTE — Progress Notes (Signed)
[place] Patient and [facility] Counseling Note   Session began with patient informing the counselor that her cancer is in complete remission and still in treatment for months. Counselor and patient processed her feelings and experiences with this situation. Counselor introduced 478 breathing, practiced with patient, and processed the experience. 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 Counselor identified a lot of should language and discussed that with the patient. Patient also described that effects from treatment "might get worse" and what that brought up for her. Counselor and patient also discussed what it is like talking to others about her experience, as well as personal growth from the experience of having cancer. Patient was oriented times three, showed no ideation, had a friendly, stressed, and somewhat sad mood, and an appropriate to context affect.Patient experiences cognitive distortions, such as "should" language about what her cancer experience and life in general 'should' be like, which brings up stress, guilt, and even shame, which results in the patient becoming dysregulated and making certain choices. More assessment is needed. Next session will continue to examine the 'shoulds' and how to begin with more adaptive thoughts.    Gaylyn Rong Counseling Intern

## 2020-02-09 NOTE — Progress Notes (Signed)
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 Patient and counselor discussed support and community, the traumatic way MyChart was where she got her diagnosis, and then moved to CBT work. Counselor gave patient psychoeducation on cognitive distortions and patient identified ones she uses often. Counselor and patient collaborated to identify patterns and helpful/unhelpful thoughts. Counselor introduced a thought log, explained how it works, and engaged in Network engineer with the patient. Patient has worked on reframing some past events before, but has trouble with unhelpful thoughts overpowering the bad. Patient and counselor made a few additions to the log and utilized imagery to help the patient. Patient was oriented times three, showed no SI/HI/NSSI, had a sad/stressed mood, and an appropriate to context affect. Patient did experience crying during emotional topics. Patient has an underlying core belief that puts pressure on her, which counselor will continue to assess for. Thoughts such as "I should get up and do something," result in guilt and shame, which might result in the patient disliking herself or putting her body through unnecessary strain. Next session will explore core beliefs, guilt, and shame.    Gaylyn Rong Counseling Intern

## 2020-02-12 ENCOUNTER — Telehealth: Payer: Self-pay | Admitting: *Deleted

## 2020-02-12 ENCOUNTER — Inpatient Hospital Stay: Payer: 59

## 2020-02-12 ENCOUNTER — Other Ambulatory Visit: Payer: Self-pay

## 2020-02-12 ENCOUNTER — Other Ambulatory Visit: Payer: Self-pay | Admitting: *Deleted

## 2020-02-12 VITALS — BP 121/80 | HR 70 | Temp 98.4°F | Resp 18

## 2020-02-12 DIAGNOSIS — D702 Other drug-induced agranulocytosis: Secondary | ICD-10-CM

## 2020-02-12 DIAGNOSIS — Z95828 Presence of other vascular implants and grafts: Secondary | ICD-10-CM

## 2020-02-12 DIAGNOSIS — Z7189 Other specified counseling: Secondary | ICD-10-CM

## 2020-02-12 DIAGNOSIS — C8118 Nodular sclerosis classical Hodgkin lymphoma, lymph nodes of multiple sites: Secondary | ICD-10-CM

## 2020-02-12 DIAGNOSIS — Z5111 Encounter for antineoplastic chemotherapy: Secondary | ICD-10-CM

## 2020-02-12 LAB — CBC WITH DIFFERENTIAL/PLATELET
Abs Immature Granulocytes: 1.49 10*3/uL — ABNORMAL HIGH (ref 0.00–0.07)
Basophils Absolute: 0.1 10*3/uL (ref 0.0–0.1)
Basophils Relative: 1 %
Eosinophils Absolute: 0.1 10*3/uL (ref 0.0–0.5)
Eosinophils Relative: 1 %
HCT: 36.5 % (ref 36.0–46.0)
Hemoglobin: 12.1 g/dL (ref 12.0–15.0)
Immature Granulocytes: 16 %
Lymphocytes Relative: 33 %
Lymphs Abs: 3 10*3/uL (ref 0.7–4.0)
MCH: 28.5 pg (ref 26.0–34.0)
MCHC: 33.2 g/dL (ref 30.0–36.0)
MCV: 85.9 fL (ref 80.0–100.0)
Monocytes Absolute: 1.4 10*3/uL — ABNORMAL HIGH (ref 0.1–1.0)
Monocytes Relative: 15 %
Neutro Abs: 3.3 10*3/uL (ref 1.7–7.7)
Neutrophils Relative %: 34 %
Platelets: 244 10*3/uL (ref 150–400)
RBC: 4.25 MIL/uL (ref 3.87–5.11)
RDW: 15.4 % (ref 11.5–15.5)
WBC: 9.3 10*3/uL (ref 4.0–10.5)
nRBC: 0 % (ref 0.0–0.2)

## 2020-02-12 LAB — CMP (CANCER CENTER ONLY)
ALT: 13 U/L (ref 0–44)
AST: 19 U/L (ref 15–41)
Albumin: 4 g/dL (ref 3.5–5.0)
Alkaline Phosphatase: 65 U/L (ref 38–126)
Anion gap: 10 (ref 5–15)
BUN: 10 mg/dL (ref 6–20)
CO2: 23 mmol/L (ref 22–32)
Calcium: 9.6 mg/dL (ref 8.9–10.3)
Chloride: 107 mmol/L (ref 98–111)
Creatinine: 0.79 mg/dL (ref 0.44–1.00)
GFR, Estimated: >60 mL/min (ref >60)
Glucose, Bld: 93 mg/dL (ref 70–99)
Potassium: 3.9 mmol/L (ref 3.5–5.1)
Sodium: 140 mmol/L (ref 135–145)
Total Bilirubin: 0.3 mg/dL (ref 0.3–1.2)
Total Protein: 6.9 g/dL (ref 6.5–8.1)

## 2020-02-12 MED ORDER — SODIUM CHLORIDE 0.9 % IV SOLN
16.0000 mg | Freq: Once | INTRAVENOUS | Status: AC
  Administered 2020-02-12: 16 mg via INTRAVENOUS
  Filled 2020-02-12: qty 8

## 2020-02-12 MED ORDER — SODIUM CHLORIDE 0.9 % IV SOLN
12.0000 mg | Freq: Once | INTRAVENOUS | Status: AC
  Administered 2020-02-12: 12 mg via INTRAVENOUS
  Filled 2020-02-12: qty 1.2

## 2020-02-12 MED ORDER — SODIUM CHLORIDE 0.9% FLUSH
10.0000 mL | INTRAVENOUS | Status: DC | PRN
  Administered 2020-02-12: 10 mL
  Filled 2020-02-12: qty 10

## 2020-02-12 MED ORDER — VINBLASTINE SULFATE CHEMO INJECTION 1 MG/ML
5.8000 mg/m2 | Freq: Once | INTRAVENOUS | Status: AC
  Administered 2020-02-12: 11 mg via INTRAVENOUS
  Filled 2020-02-12: qty 11

## 2020-02-12 MED ORDER — SODIUM CHLORIDE 0.9 % IV SOLN
Freq: Once | INTRAVENOUS | Status: AC
  Filled 2020-02-12: qty 250

## 2020-02-12 MED ORDER — ZARXIO 480 MCG/0.8ML IJ SOSY: PREFILLED_SYRINGE | INTRAMUSCULAR | 4 refills | Status: DC

## 2020-02-12 MED ORDER — SODIUM CHLORIDE 0.9 % IV SOLN
375.0000 mg/m2 | Freq: Once | INTRAVENOUS | Status: AC
  Administered 2020-02-12: 710 mg via INTRAVENOUS
  Filled 2020-02-12: qty 71

## 2020-02-12 MED ORDER — LORAZEPAM 2 MG/ML IJ SOLN
0.5000 mg | Freq: Once | INTRAMUSCULAR | Status: AC
  Administered 2020-02-12: 0.5 mg via INTRAVENOUS

## 2020-02-12 MED ORDER — HEPARIN SOD (PORK) LOCK FLUSH 100 UNIT/ML IV SOLN
500.0000 [IU] | Freq: Once | INTRAVENOUS | Status: AC | PRN
  Administered 2020-02-12: 500 [IU]
  Filled 2020-02-12: qty 5

## 2020-02-12 MED ORDER — SODIUM CHLORIDE 0.9 % IV SOLN
150.0000 mg | Freq: Once | INTRAVENOUS | Status: AC
  Administered 2020-02-12: 150 mg via INTRAVENOUS
  Filled 2020-02-12: qty 150

## 2020-02-12 MED ORDER — DOXORUBICIN HCL CHEMO IV INJECTION 2 MG/ML
25.0000 mg/m2 | Freq: Once | INTRAVENOUS | Status: AC
  Administered 2020-02-12: 48 mg via INTRAVENOUS
  Filled 2020-02-12: qty 24

## 2020-02-12 MED ORDER — LORAZEPAM 2 MG/ML IJ SOLN
INTRAMUSCULAR | Status: AC
  Filled 2020-02-12: qty 1

## 2020-02-12 NOTE — Telephone Encounter (Signed)
Patient informed infusion nurse that last Zarxio prescription was not delivered until 11/19 (chemotherapy was given on 11/12). She administered the 3 days of medication at that time. Insurance/[facility] will not send another prescription of 3 syringes now as it is "too early" by the dates on the prescription delivery. contacted [facility] - spoke with rep Angelica and pharmacist Izora Gala.  Clarified prescription with Izora Gala: patient is to administer Zarxio [place] for 3 days in a row following each chemotherapy treatment. Treatments are every 2 weeks in a 28 day cycle. New prescription called in to [facility].  Prescription will need to be filled every 2 weeks to be given after every treatment, starting tomorrow.  The pharmacy will contact patient to arrange shipment and delivery.

## 2020-02-12 NOTE — Patient Instructions (Signed)

## 2020-02-12 NOTE — Progress Notes (Signed)
Patient discharged from the Cancer [facility] in stable condition and with no signs of acute distress.    

## 2020-02-16 NOTE — Progress Notes (Signed)
[place] Patient and [facility] Counseling Note   Patient reported feeling very low energy and having a hard time getting up today, as she was feeling bad after chemo on Friday. Patient and counselor discussed patient's high school reunion, the thought log, and how it is a tool, not a requirement. Guilt and shame were a major topic, with the differences between them, where they come from, and how they developed also explored. Counselor offered that patient had tied her worth to her performance and level of contribution, which rang true with the patient. Patient described being worried about getting to 'comfortable' with her routine, knowing medical school is coming. Counselor introduced self care plans and made a plan with patient to collaborate on one in the future. Patient was oriented times three, was lethargic and somewhat stressed, and had a congruent affect. There were no indications of SI/HI/NSSI. Patient thinks "I can't get too comfortable with resting with med school coming up," which brings up stress and worry, which leads to her not taking care of herself. Next session will begin work on her self-care plan   Gaylyn Rong Counseling Intern

## 2020-02-23 NOTE — Progress Notes (Signed)
[place] Patient and [facility] Counseling Note   Patient described her differing experiences between treatment weeks and off treatment weeks. Patient also explained how she actually enjoys chemo days and coming to the [place]. Counselor offered empathetic, reflective listening. Patient identified that her should language isn't there on chemo days. Counselor and patient collaborated on self-care plan and discussed ways of overcoming potential obstacles. Counselor asked process questions and helped patient think of ways to integrate plan into her life. Patient informed counselor about medical school and both discussed what patient is looking forward to. Counselor reminded patient about gap in care and made plans to reconnect in January. Patient was oriented times three. Patient was in a happy mood and had a congruent affect. She showed no signs of SI/HI/NSSI. The patient is driven and pushes herself to do and be the best. This has resulted in the should language previously identified, which often results in guilt and not taking care of herself. By forming a self-care plan, the patient is taking preventative action and working toward both self-care and self-improvement. Next session will be scheduled in January.   Gaylyn Rong Counseling Intern

## 2020-02-29 ENCOUNTER — Inpatient Hospital Stay: Payer: 59 | Attending: Hematology

## 2020-02-29 ENCOUNTER — Inpatient Hospital Stay: Payer: 59

## 2020-02-29 ENCOUNTER — Inpatient Hospital Stay (HOSPITAL_BASED_OUTPATIENT_CLINIC_OR_DEPARTMENT_OTHER): Payer: 59 | Admitting: Hematology

## 2020-02-29 ENCOUNTER — Other Ambulatory Visit: Payer: Self-pay

## 2020-02-29 VITALS — BP 124/89 | HR 69 | Temp 99.5°F | Resp 18 | Ht 70.0 in | Wt 145.7 lb

## 2020-02-29 DIAGNOSIS — D630 Anemia in neoplastic disease: Secondary | ICD-10-CM | POA: Insufficient documentation

## 2020-02-29 DIAGNOSIS — C8118 Nodular sclerosis classical Hodgkin lymphoma, lymph nodes of multiple sites: Secondary | ICD-10-CM | POA: Diagnosis not present

## 2020-02-29 DIAGNOSIS — C8111 Nodular sclerosis classical Hodgkin lymphoma, lymph nodes of head, face, and neck: Secondary | ICD-10-CM | POA: Diagnosis present

## 2020-02-29 DIAGNOSIS — Z5111 Encounter for antineoplastic chemotherapy: Secondary | ICD-10-CM

## 2020-02-29 DIAGNOSIS — D701 Agranulocytosis secondary to cancer chemotherapy: Secondary | ICD-10-CM | POA: Insufficient documentation

## 2020-02-29 DIAGNOSIS — Z95828 Presence of other vascular implants and grafts: Secondary | ICD-10-CM

## 2020-02-29 DIAGNOSIS — Z7189 Other specified counseling: Secondary | ICD-10-CM

## 2020-02-29 LAB — CBC WITH DIFFERENTIAL/PLATELET
Abs Immature Granulocytes: 0.01 10*3/uL (ref 0.00–0.07)
Basophils Absolute: 0.1 10*3/uL (ref 0.0–0.1)
Basophils Relative: 1 %
Eosinophils Absolute: 0 10*3/uL (ref 0.0–0.5)
Eosinophils Relative: 1 %
HCT: 33.8 % — ABNORMAL LOW (ref 36.0–46.0)
Hemoglobin: 11.6 g/dL — ABNORMAL LOW (ref 12.0–15.0)
Immature Granulocytes: 0 %
Lymphocytes Relative: 29 %
Lymphs Abs: 1.5 10*3/uL (ref 0.7–4.0)
MCH: 29.8 pg (ref 26.0–34.0)
MCHC: 34.3 g/dL (ref 30.0–36.0)
MCV: 86.9 fL (ref 80.0–100.0)
Monocytes Absolute: 0.5 10*3/uL (ref 0.1–1.0)
Monocytes Relative: 9 %
Neutro Abs: 3.1 10*3/uL (ref 1.7–7.7)
Neutrophils Relative %: 60 %
Platelets: 359 10*3/uL (ref 150–400)
RBC: 3.89 MIL/uL (ref 3.87–5.11)
RDW: 16 % — ABNORMAL HIGH (ref 11.5–15.5)
WBC: 5.2 10*3/uL (ref 4.0–10.5)
nRBC: 0 % (ref 0.0–0.2)

## 2020-02-29 LAB — CMP (CANCER CENTER ONLY)
ALT: 18 U/L (ref 0–44)
AST: 21 U/L (ref 15–41)
Albumin: 3.9 g/dL (ref 3.5–5.0)
Alkaline Phosphatase: 56 U/L (ref 38–126)
Anion gap: 6 (ref 5–15)
BUN: 9 mg/dL (ref 6–20)
CO2: 26 mmol/L (ref 22–32)
Calcium: 9.6 mg/dL (ref 8.9–10.3)
Chloride: 107 mmol/L (ref 98–111)
Creatinine: 0.75 mg/dL (ref 0.44–1.00)
GFR, Estimated: >60 mL/min (ref >60)
Glucose, Bld: 91 mg/dL (ref 70–99)
Potassium: 4 mmol/L (ref 3.5–5.1)
Sodium: 139 mmol/L (ref 135–145)
Total Bilirubin: 0.6 mg/dL (ref 0.3–1.2)
Total Protein: 6.5 g/dL (ref 6.5–8.1)

## 2020-02-29 MED ORDER — SODIUM CHLORIDE 0.9 % IV SOLN
375.0000 mg/m2 | Freq: Once | INTRAVENOUS | Status: AC
  Administered 2020-02-29: 16:00:00 710 mg via INTRAVENOUS
  Filled 2020-02-29: qty 71

## 2020-02-29 MED ORDER — SODIUM CHLORIDE 0.9% FLUSH
10.0000 mL | INTRAVENOUS | Status: DC | PRN
  Administered 2020-02-29: 17:00:00 10 mL
  Filled 2020-02-29: qty 10

## 2020-02-29 MED ORDER — DOXORUBICIN HCL CHEMO IV INJECTION 2 MG/ML
25.0000 mg/m2 | Freq: Once | INTRAVENOUS | Status: AC
  Administered 2020-02-29: 16:00:00 48 mg via INTRAVENOUS
  Filled 2020-02-29: qty 24

## 2020-02-29 MED ORDER — SODIUM CHLORIDE 0.9 % IV SOLN
16.0000 mg | Freq: Once | INTRAVENOUS | Status: AC
  Administered 2020-02-29: 14:00:00 16 mg via INTRAVENOUS
  Filled 2020-02-29: qty 8

## 2020-02-29 MED ORDER — LORAZEPAM 2 MG/ML IJ SOLN
0.5000 mg | Freq: Once | INTRAMUSCULAR | Status: AC
  Administered 2020-02-29: 14:00:00 0.5 mg via INTRAVENOUS

## 2020-02-29 MED ORDER — SODIUM CHLORIDE 0.9% FLUSH
10.0000 mL | INTRAVENOUS | Status: DC | PRN
  Administered 2020-02-29: 12:00:00 10 mL
  Filled 2020-02-29: qty 10

## 2020-02-29 MED ORDER — HEPARIN SOD (PORK) LOCK FLUSH 100 UNIT/ML IV SOLN
500.0000 [IU] | Freq: Once | INTRAVENOUS | Status: AC | PRN
  Administered 2020-02-29: 17:00:00 500 [IU]
  Filled 2020-02-29: qty 5

## 2020-02-29 MED ORDER — SODIUM CHLORIDE 0.9 % IV SOLN
Freq: Once | INTRAVENOUS | Status: AC
  Filled 2020-02-29: qty 250

## 2020-02-29 MED ORDER — SODIUM CHLORIDE 0.9 % IV SOLN
150.0000 mg | Freq: Once | INTRAVENOUS | Status: AC
  Administered 2020-02-29: 15:00:00 150 mg via INTRAVENOUS
  Filled 2020-02-29: qty 150

## 2020-02-29 MED ORDER — LORAZEPAM 2 MG/ML IJ SOLN
INTRAMUSCULAR | Status: AC
  Filled 2020-02-29: qty 1

## 2020-02-29 MED ORDER — SODIUM CHLORIDE 0.9 % IV SOLN
12.0000 mg | Freq: Once | INTRAVENOUS | Status: AC
  Administered 2020-02-29: 15:00:00 12 mg via INTRAVENOUS
  Filled 2020-02-29: qty 1.2

## 2020-02-29 MED ORDER — SODIUM CHLORIDE 0.9 % IV SOLN
5.8000 mg/m2 | Freq: Once | INTRAVENOUS | Status: AC
  Administered 2020-02-29: 16:00:00 11 mg via INTRAVENOUS
  Filled 2020-02-29: qty 11

## 2020-02-29 NOTE — Patient Instructions (Signed)
[place] Discharge Instructions for Patients Receiving Chemotherapy  Today you received the following chemotherapy agents: Adriamycin/Vinblastine/Dacarbazine.  To help prevent nausea and vomiting after your treatment, we encourage you to take your nausea medication as directed.   If you develop nausea and vomiting that is not controlled by your nausea medication, call the clinic.   BELOW ARE SYMPTOMS THAT SHOULD BE REPORTED IMMEDIATELY:  *FEVER GREATER THAN 100.5 F  *CHILLS WITH OR WITHOUT FEVER  NAUSEA AND VOMITING THAT IS NOT CONTROLLED WITH YOUR NAUSEA MEDICATION  *UNUSUAL SHORTNESS OF BREATH  *UNUSUAL BRUISING OR BLEEDING  TENDERNESS IN MOUTH AND THROAT WITH OR WITHOUT PRESENCE OF ULCERS  *URINARY PROBLEMS  *BOWEL PROBLEMS  UNUSUAL RASH Items with * indicate a potential emergency and should be followed up as soon as possible.  Feel free to call the clinic should you have any questions or concerns. The clinic phone number is [phone].  Please show the [place] at check-in to the Emergency Department and triage nurse.

## 2020-02-29 NOTE — Progress Notes (Signed)
Pt discharged in no apparent distress. Pt left ambulatory without assistance. Pt aware of discharge instructions and verbalized understanding and had no further questions.  

## 2020-02-29 NOTE — Progress Notes (Signed)
Pt states she was instructed to take 2 days of Zarxio at home instead of 3 days this cycle.  Kennith Center, Pharm.D., CPP 02/29/2020@1 :46 PM

## 2020-02-29 NOTE — Progress Notes (Signed)
HEMATOLOGY/ONCOLOGY CONSULTATION NOTE  Date of Service: 02/29/2020  Patient Care Team: Pcp, No as PCP - General  CHIEF COMPLAINTS/PURPOSE OF CONSULTATION:  Nodular sclerosis classical Hodgkin lymphoma  HISTORY OF PRESENTING ILLNESS:   Sonia Doyle is a wonderful 24 y.o. female who has been referred to [place] by Dr. Fonda Kinder for evaluation and management of newly diagnosed nodular sclerosis CHL. Pt is accompanied today by her mother. The pt reports that she is doing well overall.   The pt reports that she has asthma and has had a wisdom tooth extraction and tonsillectomy. She is only using her inhaler as needed at this time. She has no other known medical issues or medication allergies. 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 She has a family history significant for skin cancers.   Pt noticed an enlarged right-sided supraclavicular lymph node, which lead to the work up. She has since noticed more enlarged lymph nodes, but also a reduction in the size of the lymph node that initially concerned her. She had a fever in June that was accompanied by chills and night sweats. Pt denies weight loss, but has had multiple itchy rashes.   Pt has not had a port placed yet, as she had to cancel her previous appointment. Pt is scheduled for a consultation at [facility] later today. She has been set up for ovum harvesting in about 7-10 days for fertility preservation. She is currently on Menopur.   Prior to her referral pt received one IV Iron test dose, which caused her to be hoarse. They did not attempt any further IV Iron infusions. She was told to continue PO Iron.   08/24/2019 US Soft Tissue revealed "Biliteral supraclavicular nodules with atypical featues for lymph nodes, consider further evaluation with enhanced CT of the chest, abdomen and pelvis for the presence of additional adenopathy".   09/03/2019 CT Chest revealed  "Anterior mediastinal, bilateral hilar and neck adenopathy most suggestive of Lymphoma".   10/09/2019 Right Cervical Lymph Node Biopsy revealed "Classic Hodgkin's lymphoma, nodular sclerosis type".   10/09/2019 Flow Cytometry Report revealed "Negative for B-cell lymphoproliferative disorder or aberrant T-cell immunophenotype"  10/27/2019 ECHO was normal  11/03/2019 Left Iliac Bone BM Bx revealed  "-Normocellular to slightly hypercellular marrow with trilineage hematopoiesis and adequate magakaryocytes - Negative for lymphoma or other form of malignancy".  11/05/2019 PET/CT revealed  "Hypermetabolic lymphadeopathy involving multiple nodal stations in the neck and chest, consistent with reported history of lymphoma."   Most recent lab results (10/20/2019) of CBC w/diff & CMP is as follows: all values are WNL except for WBC at 14.8K, PLT at 588K, MPV at 6.8, Neutro Abs at 12.08K, Glucose at 68, Albumin at 3.0, ALP at 147. 10/20/2019 LDH at 279 09/15/2019 Sed Rate at 73  On review of systems, pt reports fevers, chills, night sweats, rash, new lumps/bumps and denies unexpected weight loss, constipation, abdominal pain and any other symptoms.   On PMHx the pt reports Asthma, Tonsillectomy, Wisdom Tooth Extraction. On Social Hx the pt reports that she is a non-smoker and does not drink much alcohol outside of social situations.  On Family Hx the pt reports skin cancer.   INTERVAL HISTORY:   Sonia Doyle is a wonderful 24 y.o. female who is here for evaluation and management Nodular Sclerosing CHL. She is here for C4D1 of AVD. The patient's last visit with [place] was on 01/29/2020. The pt reports  that she is doing well overall.  The pt reports that she had itchy bumps along her right wrist about 10 days after her last treatment. This resolved without any intervention. She felt it recurring the next day, but took Benadryl which prevented any rash emergence. Pt is doing okay emotionally. She continues  to eat well and is maintaining her weight.   Lab results today (02/29/20) of CBC w/diff and CMP is as follows: all values are WNL except for Hgb at 11.6, HCT at 33.8, RDW at 16.0.  On review of systems, pt reports rash, mild constipation and denies mouth sores, fevers, chills, leg swelling, tingling/numbness in hands/feet, abdominal pain and any other symptoms.    MEDICAL HISTORY:  Childhood Asthma - not requiring preventative asthma medications/inhalers at present time   SURGICAL HISTORY: Tonsillectomy (2003) Wisdom Tooth Extraction    SOCIAL HISTORY: Social History   Socioeconomic History  . Marital status: Single    Spouse name: Not on file  . Number of children: Not on file  . Years of education: Not on file  . Highest education level: Not on file  Occupational History  . Not on file  Tobacco Use  . Smoking status: Never Smoker  . Smokeless tobacco: Never Used  Substance and Sexual Activity  . Alcohol use: Not on file  . Drug use: Not on file  . Sexual activity: Not on file  Other Topics Concern  . Not on file  Social History Narrative  . Not on file   Social Determinants of Health   Financial Resource Strain: Not on file  Food Insecurity: Not on file  Transportation Needs: Not on file  Physical Activity: Not on file  Stress: Not on file  Social Connections: Not on file  Intimate Partner Violence: Not on file    FAMILY HISTORY: Hypothyroidism - Mother  Atrial fibrillation - Maternal Grandmother Diabetes type II - Paternal Grandmother  Dementia - Paternal Grandmother   ALLERGIES:  has No Known Allergies.  MEDICATIONS:  Current Outpatient Medications  Medication Sig Dispense Refill  . albuterol (VENTOLIN HFA) 108 (90 Base) MCG/ACT inhaler Inhale 1 puff into the lungs as needed for wheezing or shortness of breath. 1 each 0  . dexamethasone (DECADRON) 4 MG tablet Take 2 tablets by mouth once a day for 3 days after chemotherapy. Take with food. 30 tablet 1   . ferrous sulfate 325 (65 FE) MG tablet Take 325 mg by mouth daily with breakfast.    . filgrastim-sndz (ZARXIO) 480 MCG/0.8ML SOSY injection Administer 480 mcg (1 syringe) of Zarxio [place] daily for 3 days starting 24h after each chemotherapy treatment every 14 days 2.4 mL 4  . fluticasone (FLOVENT HFA) 110 MCG/ACT inhaler Inhale 1 puff into the lungs as needed. 1 each 0  . fluticasone (FLOVENT HFA) 44 MCG/ACT inhaler Inhale into the lungs.    . lidocaine-prilocaine (EMLA) cream Apply to affected area once 30 g 3  . loratadine (CLARITIN) 10 MG tablet Take by mouth.    Marland Kitchen LORazepam (ATIVAN) 0.5 MG tablet Take 1 tablet (0.5 mg total) by mouth every 6 (six) hours as needed (Nausea or vomiting). 30 tablet 0  . Norgestimate-Ethinyl Estradiol Triphasic 0.18/0.215/0.25 MG-25 MCG tab Take 1 tablet by mouth daily.    . ondansetron (ZOFRAN) 8 MG tablet Take 1 tablet (8 mg total) by mouth 2 (two) times daily as needed. Start on the third day after chemotherapy. 30 tablet 1  . prochlorperazine (COMPAZINE) 10 MG tablet Take 1  tablet (10 mg total) by mouth every 6 (six) hours as needed (Nausea or vomiting). 30 tablet 1  . traMADol (ULTRAM) 50 MG tablet Take 50-100 mg by mouth every 6 (six) hours as needed.     No current facility-administered medications for this visit.    REVIEW OF SYSTEMS:   A 10+ POINT REVIEW OF SYSTEMS WAS OBTAINED including neurology, dermatology, psychiatry, cardiac, respiratory, lymph, extremities, GI, GU, Musculoskeletal, constitutional, breasts, reproductive, HEENT.  All pertinent positives are noted in the HPI.  All others are negative.   PHYSICAL EXAMINATION: ECOG PERFORMANCE STATUS: 1 - Symptomatic but completely ambulatory  .BP 124/89 (BP Location: Left Arm, Patient Position: Sitting)   Pulse 69   Temp 99.5 F (37.5 C) (Tympanic)   Resp 18   Ht 5\' 10"  (1.778 m)   Wt 145 lb 11.2 oz (66.1 kg)   SpO2 100%   BMI 20.91 kg/m    GENERAL:alert, in no acute distress and  comfortable SKIN: no acute rashes, no significant lesions EYES: conjunctiva are pink and non-injected, sclera anicteric OROPHARYNX: MMM, no exudates, no oropharyngeal erythema or ulceration NECK: supple, no JVD LYMPH:  no palpable lymphadenopathy in the cervical, axillary or inguinal regions LUNGS: clear to auscultation b/l with normal respiratory effort HEART: regular rate & rhythm ABDOMEN:  normoactive bowel sounds , non tender, not distended. No palpable hepatosplenomegaly.  Extremity: no pedal edema PSYCH: alert & oriented x 3 with fluent speech NEURO: no focal motor/sensory deficits  LABORATORY DATA:  I have reviewed the data as listed  . CBC Latest Ref Rng & Units 02/29/2020 02/12/2020 01/29/2020  WBC 4.0 - 10.5 K/uL 5.2 9.3 4.2  Hemoglobin 12.0 - 15.0 g/dL 11.6(L) 12.1 12.5  Hematocrit 36.0 - 46.0 % 33.8(L) 36.5 36.6  Platelets 150 - 400 K/uL 359 244 308   ANC 1000 . CMP Latest Ref Rng & Units 02/29/2020 02/12/2020 01/29/2020  Glucose 70 - 99 mg/dL 91 93 88  BUN 6 - 20 mg/dL 9 10 12   Creatinine 0.44 - 1.00 mg/dL 0.75 0.79 0.75  Sodium 135 - 145 mmol/L 139 140 139  Potassium 3.5 - 5.1 mmol/L 4.0 3.9 4.0  Chloride 98 - 111 mmol/L 107 107 106  CO2 22 - 32 mmol/L 26 23 26   Calcium 8.9 - 10.3 mg/dL 9.6 9.6 9.6  Total Protein 6.5 - 8.1 g/dL 6.5 6.9 6.8  Total Bilirubin 0.3 - 1.2 mg/dL 0.6 0.3 0.5  Alkaline Phos 38 - 126 U/L 56 65 56  AST 15 - 41 U/L 21 19 16   ALT 0 - 44 U/L 18 13 12      RADIOGRAPHIC STUDIES: I have personally reviewed the radiological images as listed and agreed with the findings in the report. No results found.  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  -11/05/2019 PET/CT revealed  "Hypermetabolic lymphadeopathy involving multiple nodal  stations in the neck and chest, consistent with reported history of lymphoma."  -01/20/2020 PET/CT ([id number]) revealed "1. No findings of pathologically enlarged or hypermetabolic adenopathy." 2) Anemia of chronic disease. 3) chemotherapy related leukopenia/neutropenia PLAN: -Discussed pt labwork today, 02/29/20; blood counts look good, blood chemistries are nml. -The pt has no overt prohibitive toxicities from continuing C4D1 AVD chemotherapy at this time. We will proceed with C4D15 with same doses of chemotherapy and same premedications. -Will hold G-CSF  for C4 if counts remain stable. -Will see back in 4 weeks with C5D1   FOLLOW UP: Portflush and labs with each treatment C4D15 needs to be moved from 12/23 to 12/27 (to allow for 14 days between treatments) Plz schedule C5 and C6 of treatment as per orders Portflush and labs with each treatment MD visit with C5D1 and C6D1   The total time spent in the appt was 20 minutes and more than 50% was on counseling and direct patient cares.  All of the patient's questions were answered with apparent satisfaction. The patient knows to call the clinic with any problems, questions or concerns.    Sullivan Lone MD [place] AAHIVMS [facility] Hematology/Oncology Physician [facility]  (Office):       [phone] (Work cell):  [phone] (Fax):           [phone]  02/29/2020 1:08 PM   I, Yevette Edwards, am acting as a scribe for Dr. Sullivan Lone.   .I have reviewed the above documentation for accuracy and completeness, and I agree with the above. Brunetta Genera MD

## 2020-03-10 ENCOUNTER — Other Ambulatory Visit: Payer: 59

## 2020-03-10 ENCOUNTER — Ambulatory Visit: Payer: 59

## 2020-03-14 ENCOUNTER — Inpatient Hospital Stay: Payer: 59

## 2020-03-14 ENCOUNTER — Other Ambulatory Visit: Payer: Self-pay

## 2020-03-14 VITALS — BP 133/81 | HR 82 | Temp 98.4°F | Resp 16

## 2020-03-14 DIAGNOSIS — Z5111 Encounter for antineoplastic chemotherapy: Secondary | ICD-10-CM

## 2020-03-14 DIAGNOSIS — C8118 Nodular sclerosis classical Hodgkin lymphoma, lymph nodes of multiple sites: Secondary | ICD-10-CM

## 2020-03-14 DIAGNOSIS — Z95828 Presence of other vascular implants and grafts: Secondary | ICD-10-CM

## 2020-03-14 DIAGNOSIS — Z7189 Other specified counseling: Secondary | ICD-10-CM

## 2020-03-14 LAB — CBC WITH DIFFERENTIAL/PLATELET
Abs Immature Granulocytes: 0.04 10*3/uL (ref 0.00–0.07)
Basophils Absolute: 0.1 10*3/uL (ref 0.0–0.1)
Basophils Relative: 2 %
Eosinophils Absolute: 0.2 10*3/uL (ref 0.0–0.5)
Eosinophils Relative: 5 %
HCT: 34.5 % — ABNORMAL LOW (ref 36.0–46.0)
Hemoglobin: 11.9 g/dL — ABNORMAL LOW (ref 12.0–15.0)
Immature Granulocytes: 1 %
Lymphocytes Relative: 51 %
Lymphs Abs: 1.6 10*3/uL (ref 0.7–4.0)
MCH: 30.4 pg (ref 26.0–34.0)
MCHC: 34.5 g/dL (ref 30.0–36.0)
MCV: 88 fL (ref 80.0–100.0)
Monocytes Absolute: 0.4 10*3/uL (ref 0.1–1.0)
Monocytes Relative: 13 %
Neutro Abs: 0.9 10*3/uL — ABNORMAL LOW (ref 1.7–7.7)
Neutrophils Relative %: 28 %
Platelets: 328 10*3/uL (ref 150–400)
RBC: 3.92 MIL/uL (ref 3.87–5.11)
RDW: 15.6 % — ABNORMAL HIGH (ref 11.5–15.5)
WBC: 3.2 10*3/uL — ABNORMAL LOW (ref 4.0–10.5)
nRBC: 0 % (ref 0.0–0.2)

## 2020-03-14 LAB — CMP (CANCER CENTER ONLY)
ALT: 13 U/L (ref 0–44)
AST: 19 U/L (ref 15–41)
Albumin: 3.8 g/dL (ref 3.5–5.0)
Alkaline Phosphatase: 48 U/L (ref 38–126)
Anion gap: 9 (ref 5–15)
BUN: 9 mg/dL (ref 6–20)
CO2: 24 mmol/L (ref 22–32)
Calcium: 9.2 mg/dL (ref 8.9–10.3)
Chloride: 107 mmol/L (ref 98–111)
Creatinine: 0.82 mg/dL (ref 0.44–1.00)
GFR, Estimated: >60 mL/min (ref >60)
Glucose, Bld: 127 mg/dL — ABNORMAL HIGH (ref 70–99)
Potassium: 3.4 mmol/L — ABNORMAL LOW (ref 3.5–5.1)
Sodium: 140 mmol/L (ref 135–145)
Total Bilirubin: 0.4 mg/dL (ref 0.3–1.2)
Total Protein: 6.4 g/dL — ABNORMAL LOW (ref 6.5–8.1)

## 2020-03-14 MED ORDER — SODIUM CHLORIDE 0.9% FLUSH
10.0000 mL | INTRAVENOUS | Status: DC | PRN
  Administered 2020-03-14: 08:00:00 10 mL
  Filled 2020-03-14: qty 10

## 2020-03-14 MED ORDER — SODIUM CHLORIDE 0.9 % IV SOLN
150.0000 mg | Freq: Once | INTRAVENOUS | Status: AC
  Administered 2020-03-14: 09:00:00 150 mg via INTRAVENOUS
  Filled 2020-03-14: qty 150

## 2020-03-14 MED ORDER — VINBLASTINE SULFATE CHEMO INJECTION 1 MG/ML
5.8000 mg/m2 | Freq: Once | INTRAVENOUS | Status: AC
  Administered 2020-03-14: 10:00:00 11 mg via INTRAVENOUS
  Filled 2020-03-14: qty 11

## 2020-03-14 MED ORDER — LORAZEPAM 2 MG/ML IJ SOLN: 0.5000 mg | Freq: Once | INTRAMUSCULAR | Status: DC

## 2020-03-14 MED ORDER — SODIUM CHLORIDE 0.9 % IV SOLN
375.0000 mg/m2 | Freq: Once | INTRAVENOUS | Status: AC
  Administered 2020-03-14: 10:00:00 710 mg via INTRAVENOUS
  Filled 2020-03-14: qty 71

## 2020-03-14 MED ORDER — HEPARIN SOD (PORK) LOCK FLUSH 100 UNIT/ML IV SOLN
500.0000 [IU] | Freq: Once | INTRAVENOUS | Status: AC | PRN
  Administered 2020-03-14: 11:00:00 500 [IU]
  Filled 2020-03-14: qty 5

## 2020-03-14 MED ORDER — SODIUM CHLORIDE 0.9% FLUSH
10.0000 mL | INTRAVENOUS | Status: DC | PRN
  Administered 2020-03-14: 11:00:00 10 mL
  Filled 2020-03-14: qty 10

## 2020-03-14 MED ORDER — DOXORUBICIN HCL CHEMO IV INJECTION 2 MG/ML
25.0000 mg/m2 | Freq: Once | INTRAVENOUS | Status: AC
  Administered 2020-03-14: 10:00:00 48 mg via INTRAVENOUS
  Filled 2020-03-14: qty 24

## 2020-03-14 MED ORDER — SODIUM CHLORIDE 0.9 % IV SOLN
Freq: Once | INTRAVENOUS | Status: AC
  Filled 2020-03-14: qty 250

## 2020-03-14 MED ORDER — SODIUM CHLORIDE 0.9 % IV SOLN
12.0000 mg | Freq: Once | INTRAVENOUS | Status: AC
  Administered 2020-03-14: 09:00:00 12 mg via INTRAVENOUS
  Filled 2020-03-14: qty 1.2

## 2020-03-14 MED ORDER — SODIUM CHLORIDE 0.9 % IV SOLN
16.0000 mg | Freq: Once | INTRAVENOUS | Status: AC
  Administered 2020-03-14: 09:00:00 16 mg via INTRAVENOUS
  Filled 2020-03-14: qty 8

## 2020-03-14 NOTE — Progress Notes (Signed)
Per Dr. Candise Che, ok to treat with low ANC. Advised to have patient to self-administer a total of 3 doses of filgrastim after this treatment. Patient aware and verbalizes understanding.

## 2020-03-14 NOTE — Patient Instructions (Signed)
[facility] Discharge Instructions for Patients Receiving Chemotherapy  Today you received the following chemotherapy agents: doxorubicin, vinblastine, and dacarbazine.  To help prevent nausea and vomiting after your treatment, we encourage you to take your nausea medication as directed.   If you develop nausea and vomiting that is not controlled by your nausea medication, call the clinic.   BELOW ARE SYMPTOMS THAT SHOULD BE REPORTED IMMEDIATELY:  *FEVER GREATER THAN 100.5 F  *CHILLS WITH OR WITHOUT FEVER  NAUSEA AND VOMITING THAT IS NOT CONTROLLED WITH YOUR NAUSEA MEDICATION  *UNUSUAL SHORTNESS OF BREATH  *UNUSUAL BRUISING OR BLEEDING  TENDERNESS IN MOUTH AND THROAT WITH OR WITHOUT PRESENCE OF ULCERS  *URINARY PROBLEMS  *BOWEL PROBLEMS  UNUSUAL RASH Items with * indicate a potential emergency and should be followed up as soon as possible.  Feel free to call the clinic should you have any questions or concerns. The clinic phone number is [phone].  Please show the CHEMO ALERT CARD at check-in to the Emergency Department and triage nurse.   

## 2020-03-21 ENCOUNTER — Encounter: Payer: Self-pay | Admitting: Hematology

## 2020-03-22 ENCOUNTER — Other Ambulatory Visit: Payer: Self-pay | Admitting: Hematology

## 2020-03-22 ENCOUNTER — Telehealth: Payer: Self-pay | Admitting: *Deleted

## 2020-03-22 MED ORDER — LEVOFLOXACIN 500 MG PO TABS: 500.0000 mg | ORAL_TABLET | Freq: Every day | ORAL | 0 refills | Status: DC

## 2020-03-22 NOTE — Telephone Encounter (Signed)
Patient called - having cold like symptoms for past 10 days: runny nose, congestion and cough developed in past 3 days. No fever. Drainage has no color. Took 2 at home Covid tests (-). Then had PCR test 5 days ago (5 days after onset of symptoms) and it was also negative. Dr. Candise Che informed. Per Dr. Candise Che, will send antibiotic to pharmacy. Contacted patient with this information. LVM with info.

## 2020-03-24 ENCOUNTER — Encounter: Payer: Self-pay | Admitting: Hematology

## 2020-03-31 ENCOUNTER — Inpatient Hospital Stay: Payer: 59 | Attending: Hematology

## 2020-03-31 ENCOUNTER — Other Ambulatory Visit: Payer: Self-pay

## 2020-03-31 ENCOUNTER — Inpatient Hospital Stay: Payer: 59

## 2020-03-31 ENCOUNTER — Inpatient Hospital Stay: Payer: 59 | Admitting: Hematology

## 2020-03-31 VITALS — BP 115/72 | HR 78 | Temp 97.8°F | Resp 20 | Ht 70.0 in | Wt 144.8 lb

## 2020-03-31 DIAGNOSIS — C8111 Nodular sclerosis classical Hodgkin lymphoma, lymph nodes of head, face, and neck: Secondary | ICD-10-CM | POA: Diagnosis present

## 2020-03-31 DIAGNOSIS — C8118 Nodular sclerosis classical Hodgkin lymphoma, lymph nodes of multiple sites: Secondary | ICD-10-CM

## 2020-03-31 DIAGNOSIS — Z5111 Encounter for antineoplastic chemotherapy: Secondary | ICD-10-CM

## 2020-03-31 DIAGNOSIS — Z95828 Presence of other vascular implants and grafts: Secondary | ICD-10-CM

## 2020-03-31 DIAGNOSIS — D701 Agranulocytosis secondary to cancer chemotherapy: Secondary | ICD-10-CM | POA: Insufficient documentation

## 2020-03-31 DIAGNOSIS — D630 Anemia in neoplastic disease: Secondary | ICD-10-CM | POA: Insufficient documentation

## 2020-03-31 DIAGNOSIS — Z7189 Other specified counseling: Secondary | ICD-10-CM

## 2020-03-31 LAB — CMP (CANCER CENTER ONLY)
ALT: 11 U/L (ref 0–44)
AST: 17 U/L (ref 15–41)
Albumin: 3.9 g/dL (ref 3.5–5.0)
Alkaline Phosphatase: 58 U/L (ref 38–126)
Anion gap: 6 (ref 5–15)
BUN: 11 mg/dL (ref 6–20)
CO2: 26 mmol/L (ref 22–32)
Calcium: 9.6 mg/dL (ref 8.9–10.3)
Chloride: 108 mmol/L (ref 98–111)
Creatinine: 0.82 mg/dL (ref 0.44–1.00)
GFR, Estimated: >60 mL/min (ref >60)
Glucose, Bld: 89 mg/dL (ref 70–99)
Potassium: 4 mmol/L (ref 3.5–5.1)
Sodium: 140 mmol/L (ref 135–145)
Total Bilirubin: 0.6 mg/dL (ref 0.3–1.2)
Total Protein: 6.6 g/dL (ref 6.5–8.1)

## 2020-03-31 LAB — CBC WITH DIFFERENTIAL/PLATELET
Abs Immature Granulocytes: 0.01 10*3/uL (ref 0.00–0.07)
Basophils Absolute: 0.1 10*3/uL (ref 0.0–0.1)
Basophils Relative: 3 %
Eosinophils Absolute: 0 10*3/uL (ref 0.0–0.5)
Eosinophils Relative: 1 %
HCT: 35.2 % — ABNORMAL LOW (ref 36.0–46.0)
Hemoglobin: 12.3 g/dL (ref 12.0–15.0)
Immature Granulocytes: 0 %
Lymphocytes Relative: 39 %
Lymphs Abs: 1.6 10*3/uL (ref 0.7–4.0)
MCH: 30.5 pg (ref 26.0–34.0)
MCHC: 34.9 g/dL (ref 30.0–36.0)
MCV: 87.3 fL (ref 80.0–100.0)
Monocytes Absolute: 0.5 10*3/uL (ref 0.1–1.0)
Monocytes Relative: 12 %
Neutro Abs: 1.8 10*3/uL (ref 1.7–7.7)
Neutrophils Relative %: 45 %
Platelets: 376 10*3/uL (ref 150–400)
RBC: 4.03 MIL/uL (ref 3.87–5.11)
RDW: 13.8 % (ref 11.5–15.5)
WBC: 4 10*3/uL (ref 4.0–10.5)
nRBC: 0 % (ref 0.0–0.2)

## 2020-03-31 MED ORDER — SODIUM CHLORIDE 0.9% FLUSH
10.0000 mL | INTRAVENOUS | Status: DC | PRN
  Administered 2020-03-31: 10 mL
  Filled 2020-03-31: qty 10

## 2020-03-31 MED ORDER — DOXORUBICIN HCL CHEMO IV INJECTION 2 MG/ML
25.0000 mg/m2 | Freq: Once | INTRAVENOUS | Status: AC
  Administered 2020-03-31: 48 mg via INTRAVENOUS
  Filled 2020-03-31: qty 24

## 2020-03-31 MED ORDER — VINBLASTINE SULFATE CHEMO INJECTION 1 MG/ML
5.8000 mg/m2 | Freq: Once | INTRAVENOUS | Status: AC
  Administered 2020-03-31: 11 mg via INTRAVENOUS
  Filled 2020-03-31: qty 11

## 2020-03-31 MED ORDER — SODIUM CHLORIDE 0.9 % IV SOLN
Freq: Once | INTRAVENOUS | Status: AC
  Filled 2020-03-31: qty 250

## 2020-03-31 MED ORDER — SODIUM CHLORIDE 0.9 % IV SOLN
150.0000 mg | Freq: Once | INTRAVENOUS | Status: AC
  Administered 2020-03-31: 150 mg via INTRAVENOUS
  Filled 2020-03-31: qty 150

## 2020-03-31 MED ORDER — SODIUM CHLORIDE 0.9 % IV SOLN
12.0000 mg | Freq: Once | INTRAVENOUS | Status: AC
  Administered 2020-03-31: 12 mg via INTRAVENOUS
  Filled 2020-03-31: qty 1.2

## 2020-03-31 MED ORDER — SODIUM CHLORIDE 0.9 % IV SOLN
16.0000 mg | Freq: Once | INTRAVENOUS | Status: AC
  Administered 2020-03-31: 16 mg via INTRAVENOUS
  Filled 2020-03-31: qty 8

## 2020-03-31 MED ORDER — HEPARIN SOD (PORK) LOCK FLUSH 100 UNIT/ML IV SOLN
500.0000 [IU] | Freq: Once | INTRAVENOUS | Status: AC | PRN
  Administered 2020-03-31: 500 [IU]
  Filled 2020-03-31: qty 5

## 2020-03-31 MED ORDER — LORAZEPAM 2 MG/ML IJ SOLN
INTRAMUSCULAR | Status: AC
  Filled 2020-03-31: qty 1

## 2020-03-31 MED ORDER — DACARBAZINE 200 MG IV SOLR
375.0000 mg/m2 | Freq: Once | INTRAVENOUS | Status: AC
  Administered 2020-03-31: 710 mg via INTRAVENOUS
  Filled 2020-03-31: qty 71

## 2020-03-31 MED ORDER — LORAZEPAM 2 MG/ML IJ SOLN: 0.5000 mg | Freq: Once | INTRAMUSCULAR | Status: DC

## 2020-03-31 NOTE — Progress Notes (Signed)
HEMATOLOGY/ONCOLOGY CONSULTATION NOTE  Date of Service: 03/31/2020  Patient Care Team: Pcp, No as PCP - General  CHIEF COMPLAINTS/PURPOSE OF CONSULTATION:  Nodular sclerosis classical Hodgkin lymphoma  HISTORY OF PRESENTING ILLNESS:   Sonia Doyle is a wonderful 25 y.o. female who has been referred to [place] by Dr. Fonda Kinder for evaluation and management of newly diagnosed nodular sclerosis CHL. Pt is accompanied today by her mother. The pt reports that she is doing well overall.   The pt reports that she has asthma and has had a wisdom tooth extraction and tonsillectomy. She is only using her inhaler as needed at this time. She has no other known medical issues or medication allergies. 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 She has a family history significant for skin cancers.   Pt noticed an enlarged right-sided supraclavicular lymph node, which lead to the work up. She has since noticed more enlarged lymph nodes, but also a reduction in the size of the lymph node that initially concerned her. She had a fever in June that was accompanied by chills and night sweats. Pt denies weight loss, but has had multiple itchy rashes.   Pt has not had a port placed yet, as she had to cancel her previous appointment. Pt is scheduled for a consultation at [facility] later today. She has been set up for ovum harvesting in about 7-10 days for fertility preservation. She is currently on Menopur.   Prior to her referral pt received one IV Iron test dose, which caused her to be hoarse. They did not attempt any further IV Iron infusions. She was told to continue PO Iron.   08/24/2019 US Soft Tissue revealed "Biliteral supraclavicular nodules with atypical featues for lymph nodes, consider further evaluation with enhanced CT of the chest, abdomen and pelvis for the presence of additional adenopathy".   09/03/2019 CT Chest revealed  "Anterior mediastinal, bilateral hilar and neck adenopathy most suggestive of Lymphoma".   10/09/2019 Right Cervical Lymph Node Biopsy revealed "Classic Hodgkin's lymphoma, nodular sclerosis type".   10/09/2019 Flow Cytometry Report revealed "Negative for B-cell lymphoproliferative disorder or aberrant T-cell immunophenotype"  10/27/2019 ECHO was normal  11/03/2019 Left Iliac Bone BM Bx revealed  "-Normocellular to slightly hypercellular marrow with trilineage hematopoiesis and adequate magakaryocytes - Negative for lymphoma or other form of malignancy".  11/05/2019 PET/CT revealed  "Hypermetabolic lymphadeopathy involving multiple nodal stations in the neck and chest, consistent with reported history of lymphoma."   Most recent lab results (10/20/2019) of CBC w/diff & CMP is as follows: all values are WNL except for WBC at 14.8K, PLT at 588K, MPV at 6.8, Neutro Abs at 12.08K, Glucose at 68, Albumin at 3.0, ALP at 147. 10/20/2019 LDH at 279 09/15/2019 Sed Rate at 73  On review of systems, pt reports fevers, chills, night sweats, rash, new lumps/bumps and denies unexpected weight loss, constipation, abdominal pain and any other symptoms.   On PMHx the pt reports Asthma, Tonsillectomy, Wisdom Tooth Extraction. On Social Hx the pt reports that she is a non-smoker and does not drink much alcohol outside of social situations.  On Family Hx the pt reports skin cancer.   INTERVAL HISTORY:   Sonia Doyle is a wonderful 25 y.o. female who is here for evaluation and management Nodular Sclerosing CHL. She is here for C5D1 of AVD. The patient's last visit with [place] was on 02/29/2020. The pt reports  that she is doing well overall.  The pt reports her URTI has resolved with antibiotics. covid testing was neg. No current fevers/chills/night sweats or shortness of breath. No acute new skin rashes  Lab results today (03/31/20) of CBC w/diff and CMP - are stable.  On review of systems, pt reports no  other acute treatment toxicities.  MEDICAL HISTORY:  Childhood Asthma - not requiring preventative asthma medications/inhalers at present time   SURGICAL HISTORY: Tonsillectomy (2003) Wisdom Tooth Extraction    SOCIAL HISTORY: Social History   Socioeconomic History  . Marital status: Single    Spouse name: Not on file  . Number of children: Not on file  . Years of education: Not on file  . Highest education level: Not on file  Occupational History  . Not on file  Tobacco Use  . Smoking status: Never Smoker  . Smokeless tobacco: Never Used  Substance and Sexual Activity  . Alcohol use: Not on file  . Drug use: Not on file  . Sexual activity: Not on file  Other Topics Concern  . Not on file  Social History Narrative  . Not on file   Social Determinants of Health   Financial Resource Strain: Not on file  Food Insecurity: Not on file  Transportation Needs: Not on file  Physical Activity: Not on file  Stress: Not on file  Social Connections: Not on file  Intimate Partner Violence: Not on file    FAMILY HISTORY: Hypothyroidism - Mother  Atrial fibrillation - Maternal Grandmother Diabetes type II - Paternal Grandmother  Dementia - Paternal Grandmother   ALLERGIES:  has No Known Allergies.  MEDICATIONS:  Current Outpatient Medications  Medication Sig Dispense Refill  . albuterol (VENTOLIN HFA) 108 (90 Base) MCG/ACT inhaler Inhale 1 puff into the lungs as needed for wheezing or shortness of breath. 1 each 0  . dexamethasone (DECADRON) 4 MG tablet Take 2 tablets by mouth once a day for 3 days after chemotherapy. Take with food. 30 tablet 1  . ferrous sulfate 325 (65 FE) MG tablet Take 325 mg by mouth daily with breakfast.    . filgrastim-sndz (ZARXIO) 480 MCG/0.8ML SOSY injection Administer 480 mcg (1 syringe) of Zarxio [place] daily for 3 days starting 24h after each chemotherapy treatment every 14 days 2.4 mL 4  . fluticasone (FLOVENT HFA) 110 MCG/ACT inhaler Inhale 1  puff into the lungs as needed. 1 each 0  . fluticasone (FLOVENT HFA) 44 MCG/ACT inhaler Inhale into the lungs.    Marland Kitchen levofloxacin (LEVAQUIN) 500 MG tablet Take 1 tablet (500 mg total) by mouth daily. 5 tablet 0  . lidocaine-prilocaine (EMLA) cream Apply to affected area once 30 g 3  . loratadine (CLARITIN) 10 MG tablet Take by mouth.    Marland Kitchen LORazepam (ATIVAN) 0.5 MG tablet Take 1 tablet (0.5 mg total) by mouth every 6 (six) hours as needed (Nausea or vomiting). 30 tablet 0  . Norgestimate-Ethinyl Estradiol Triphasic 0.18/0.215/0.25 MG-25 MCG tab Take 1 tablet by mouth daily.    . ondansetron (ZOFRAN) 8 MG tablet Take 1 tablet (8 mg total) by mouth 2 (two) times daily as needed. Start on the third day after chemotherapy. 30 tablet 1  . prochlorperazine (COMPAZINE) 10 MG tablet Take 1 tablet (10 mg total) by mouth every 6 (six) hours as needed (Nausea or vomiting). 30 tablet 1  . traMADol (ULTRAM) 50 MG tablet Take 50-100 mg by mouth every 6 (six) hours as needed.     No current  facility-administered medications for this visit.    REVIEW OF SYSTEMS:   A 10+ POINT REVIEW OF SYSTEMS WAS OBTAINED including neurology, dermatology, psychiatry, cardiac, respiratory, lymph, extremities, GI, GU, Musculoskeletal, constitutional, breasts, reproductive, HEENT.  All pertinent positives are noted in the HPI.  All others are negative.   PHYSICAL EXAMINATION: ECOG PERFORMANCE STATUS: 1 - Symptomatic but completely ambulatory  .BP 115/72   Pulse 78   Temp 97.8 F (36.6 C) (Tympanic)   Resp 20   Ht 5\' 10"  (1.778 m)   Wt 144 lb 12.8 oz (65.7 kg)   SpO2 100%   BMI 20.78 kg/m    NAD GENERAL:alert, in no acute distress and comfortable SKIN: no acute rashes, no significant lesions EYES: conjunctiva are pink and non-injected, sclera anicteric OROPHARYNX: MMM, no exudates, no oropharyngeal erythema or ulceration NECK: supple, no JVD LYMPH:  no palpable lymphadenopathy in the cervical, axillary or inguinal  regions LUNGS: clear to auscultation b/l with normal respiratory effort HEART: regular rate & rhythm ABDOMEN:  normoactive bowel sounds , non tender, not distended. No palpable hepatosplenomegaly.  Extremity: no pedal edema PSYCH: alert & oriented x 3 with fluent speech NEURO: no focal motor/sensory deficits  LABORATORY DATA:  I have reviewed the data as listed  . CBC Latest Ref Rng & Units 03/14/2020 02/29/2020 02/12/2020  WBC 4.0 - 10.5 K/uL 3.2(L) 5.2 9.3  Hemoglobin 12.0 - 15.0 g/dL 11.9(L) 11.6(L) 12.1  Hematocrit 36.0 - 46.0 % 34.5(L) 33.8(L) 36.5  Platelets 150 - 400 K/uL 328 359 244   ANC 1000 . CMP Latest Ref Rng & Units 03/14/2020 02/29/2020 02/12/2020  Glucose 70 - 99 mg/dL 127(H) 91 93  BUN 6 - 20 mg/dL 9 9 10   Creatinine 0.44 - 1.00 mg/dL 0.82 0.75 0.79  Sodium 135 - 145 mmol/L 140 139 140  Potassium 3.5 - 5.1 mmol/L 3.4(L) 4.0 3.9  Chloride 98 - 111 mmol/L 107 107 107  CO2 22 - 32 mmol/L 24 26 23   Calcium 8.9 - 10.3 mg/dL 9.2 9.6 9.6  Total Protein 6.5 - 8.1 g/dL 6.4(L) 6.5 6.9  Total Bilirubin 0.3 - 1.2 mg/dL 0.4 0.6 0.3  Alkaline Phos 38 - 126 U/L 48 56 65  AST 15 - 41 U/L 19 21 19   ALT 0 - 44 U/L 13 18 13      RADIOGRAPHIC STUDIES: I have personally reviewed the radiological images as listed and agreed with the findings in the report. No results found.  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  -11/05/2019 PET/CT revealed  "Hypermetabolic lymphadeopathy involving multiple nodal stations in the neck and chest, consistent with reported history of lymphoma."  -01/20/2020 PET/CT ([id number]) revealed "1. No findings of pathologically enlarged or hypermetabolic adenopathy." 2) Anemia of chronic disease. 3) chemotherapy related leukopenia/neutropenia -  resolved . On G-CSF PLAN: -labs stable. -recent URI resolved -The pt has no overt prohibitive toxicities from continuing C5D1 AVD chemotherapy at this time. We will proceed with C5D15 with same doses of chemotherapy and same premedications. -continue 3 days of filgrastim at home after each treatment  FOLLOW UP: Plz add MD visit with C6D1 on 2/10 C6D15 has to be 14-15 days after 2/10 cannot be on 2/21 as scheduled currently   The total time spent in the appt was 20 minutes and more than 50% was on  counseling and direct patient cares.  All of the patient's questions were answered with apparent satisfaction. The patient knows to call the clinic with any problems, questions or concerns.    Sullivan Lone MD [place] AAHIVMS [facility] Hematology/Oncology Physician [facility]  (Office):       [phone] (Work cell):  [phone] (Fax):           [phone]  03/31/2020 2:00 AM   I, Yevette Edwards, am acting as a scribe for Dr. Sullivan Lone.   .I have reviewed the above documentation for accuracy and completeness, and I agree with the above. Brunetta Genera MD

## 2020-03-31 NOTE — Progress Notes (Signed)
Ativan pulled up as premed per order.  Pt stated she did not want ativan today.  Unable to waste in pyxis.  Wasted in stericycle with Tylene Fantasia, RN

## 2020-03-31 NOTE — Patient Instructions (Signed)
[facility] Discharge Instructions for Patients Receiving Chemotherapy  Today you received the following chemotherapy agents: doxorubicin, vinblastine, and dacarbazine.  To help prevent nausea and vomiting after your treatment, we encourage you to take your nausea medication as directed.   If you develop nausea and vomiting that is not controlled by your nausea medication, call the clinic.   BELOW ARE SYMPTOMS THAT SHOULD BE REPORTED IMMEDIATELY:  *FEVER GREATER THAN 100.5 F  *CHILLS WITH OR WITHOUT FEVER  NAUSEA AND VOMITING THAT IS NOT CONTROLLED WITH YOUR NAUSEA MEDICATION  *UNUSUAL SHORTNESS OF BREATH  *UNUSUAL BRUISING OR BLEEDING  TENDERNESS IN MOUTH AND THROAT WITH OR WITHOUT PRESENCE OF ULCERS  *URINARY PROBLEMS  *BOWEL PROBLEMS  UNUSUAL RASH Items with * indicate a potential emergency and should be followed up as soon as possible.  Feel free to call the clinic should you have any questions or concerns. The clinic phone number is [phone].  Please show the CHEMO ALERT CARD at check-in to the Emergency Department and triage nurse.   

## 2020-04-11 ENCOUNTER — Other Ambulatory Visit: Payer: 59

## 2020-04-11 ENCOUNTER — Ambulatory Visit: Payer: 59

## 2020-04-14 ENCOUNTER — Inpatient Hospital Stay: Payer: 59

## 2020-04-14 ENCOUNTER — Other Ambulatory Visit: Payer: Self-pay

## 2020-04-14 ENCOUNTER — Other Ambulatory Visit: Payer: Self-pay | Admitting: *Deleted

## 2020-04-14 VITALS — BP 120/82 | HR 86 | Temp 98.6°F | Resp 18 | Wt 147.5 lb

## 2020-04-14 DIAGNOSIS — C8118 Nodular sclerosis classical Hodgkin lymphoma, lymph nodes of multiple sites: Secondary | ICD-10-CM

## 2020-04-14 DIAGNOSIS — Z95828 Presence of other vascular implants and grafts: Secondary | ICD-10-CM

## 2020-04-14 DIAGNOSIS — Z7189 Other specified counseling: Secondary | ICD-10-CM

## 2020-04-14 DIAGNOSIS — Z5111 Encounter for antineoplastic chemotherapy: Secondary | ICD-10-CM | POA: Diagnosis not present

## 2020-04-14 DIAGNOSIS — D702 Other drug-induced agranulocytosis: Secondary | ICD-10-CM

## 2020-04-14 LAB — CMP (CANCER CENTER ONLY)
ALT: 9 U/L (ref 0–44)
AST: 17 U/L (ref 15–41)
Albumin: 4.1 g/dL (ref 3.5–5.0)
Alkaline Phosphatase: 59 U/L (ref 38–126)
Anion gap: 7 (ref 5–15)
BUN: 9 mg/dL (ref 6–20)
CO2: 24 mmol/L (ref 22–32)
Calcium: 9.3 mg/dL (ref 8.9–10.3)
Chloride: 110 mmol/L (ref 98–111)
Creatinine: 0.75 mg/dL (ref 0.44–1.00)
GFR, Estimated: >60 mL/min (ref >60)
Glucose, Bld: 97 mg/dL (ref 70–99)
Potassium: 4.1 mmol/L (ref 3.5–5.1)
Sodium: 141 mmol/L (ref 135–145)
Total Bilirubin: 0.4 mg/dL (ref 0.3–1.2)
Total Protein: 6.7 g/dL (ref 6.5–8.1)

## 2020-04-14 LAB — CBC WITH DIFFERENTIAL/PLATELET
Abs Immature Granulocytes: 0.02 10*3/uL (ref 0.00–0.07)
Basophils Absolute: 0.1 10*3/uL (ref 0.0–0.1)
Basophils Relative: 1 %
Eosinophils Absolute: 0 10*3/uL (ref 0.0–0.5)
Eosinophils Relative: 1 %
HCT: 35.4 % — ABNORMAL LOW (ref 36.0–46.0)
Hemoglobin: 12.4 g/dL (ref 12.0–15.0)
Immature Granulocytes: 1 %
Lymphocytes Relative: 31 %
Lymphs Abs: 1.4 10*3/uL (ref 0.7–4.0)
MCH: 31.4 pg (ref 26.0–34.0)
MCHC: 35 g/dL (ref 30.0–36.0)
MCV: 89.6 fL (ref 80.0–100.0)
Monocytes Absolute: 0.6 10*3/uL (ref 0.1–1.0)
Monocytes Relative: 13 %
Neutro Abs: 2.3 10*3/uL (ref 1.7–7.7)
Neutrophils Relative %: 53 %
Platelets: 336 10*3/uL (ref 150–400)
RBC: 3.95 MIL/uL (ref 3.87–5.11)
RDW: 13.8 % (ref 11.5–15.5)
WBC: 4.4 10*3/uL (ref 4.0–10.5)
nRBC: 0 % (ref 0.0–0.2)

## 2020-04-14 MED ORDER — VINBLASTINE SULFATE CHEMO INJECTION 1 MG/ML
5.8000 mg/m2 | Freq: Once | INTRAVENOUS | Status: AC
  Administered 2020-04-14: 11 mg via INTRAVENOUS
  Filled 2020-04-14: qty 11

## 2020-04-14 MED ORDER — SODIUM CHLORIDE 0.9 % IV SOLN
16.0000 mg | Freq: Once | INTRAVENOUS | Status: AC
  Administered 2020-04-14: 16 mg via INTRAVENOUS
  Filled 2020-04-14: qty 8

## 2020-04-14 MED ORDER — SODIUM CHLORIDE 0.9 % IV SOLN
12.0000 mg | Freq: Once | INTRAVENOUS | Status: AC
  Administered 2020-04-14: 12 mg via INTRAVENOUS
  Filled 2020-04-14: qty 1.2

## 2020-04-14 MED ORDER — LORAZEPAM 2 MG/ML IJ SOLN
INTRAMUSCULAR | Status: AC
  Filled 2020-04-14: qty 1

## 2020-04-14 MED ORDER — SODIUM CHLORIDE 0.9 % IV SOLN
Freq: Once | INTRAVENOUS | Status: AC
  Filled 2020-04-14: qty 250

## 2020-04-14 MED ORDER — LORAZEPAM 2 MG/ML IJ SOLN
0.5000 mg | Freq: Once | INTRAMUSCULAR | Status: AC
  Administered 2020-04-14: 0.5 mg via INTRAVENOUS

## 2020-04-14 MED ORDER — HEPARIN SOD (PORK) LOCK FLUSH 100 UNIT/ML IV SOLN
500.0000 [IU] | Freq: Once | INTRAVENOUS | Status: AC | PRN
  Administered 2020-04-14: 500 [IU]
  Filled 2020-04-14: qty 5

## 2020-04-14 MED ORDER — SODIUM CHLORIDE 0.9 % IV SOLN
150.0000 mg | Freq: Once | INTRAVENOUS | Status: AC
  Administered 2020-04-14: 150 mg via INTRAVENOUS
  Filled 2020-04-14: qty 150

## 2020-04-14 MED ORDER — SODIUM CHLORIDE 0.9% FLUSH
10.0000 mL | INTRAVENOUS | Status: DC | PRN
  Administered 2020-04-14: 10 mL
  Filled 2020-04-14: qty 10

## 2020-04-14 MED ORDER — DOXORUBICIN HCL CHEMO IV INJECTION 2 MG/ML
25.0000 mg/m2 | Freq: Once | INTRAVENOUS | Status: AC
  Administered 2020-04-14: 48 mg via INTRAVENOUS
  Filled 2020-04-14: qty 24

## 2020-04-14 MED ORDER — ZARXIO 480 MCG/0.8ML IJ SOSY: PREFILLED_SYRINGE | INTRAMUSCULAR | 2 refills | Status: DC

## 2020-04-14 MED ORDER — SODIUM CHLORIDE 0.9 % IV SOLN
375.0000 mg/m2 | Freq: Once | INTRAVENOUS | Status: AC
  Administered 2020-04-14: 710 mg via INTRAVENOUS
  Filled 2020-04-14: qty 71

## 2020-04-14 NOTE — Progress Notes (Signed)
[facility] and [facility] Counseling Note  Session began by catching up and seeing how the patient has been for the last six weeks. Patient reported on her self-care chart, how she has been kinder to herself, and her plan to move back to [place] in March. Patient identified that the cancer experience can be "dehumanizing," to which counselor responded with empathy and active listening. Patient and counselor discussed hair loss as a loss of identity and a sign that she is "actively ill." Patient reported feeling shallow and guilty for worrying so much about that. Counselor provided validation of feelings. Counselor and patient explore future directions and collaborated to identify topics to explore. Counselor provided active listening and reflection. Patient was oriented times three. Her mood varied between happy, sad, and stayed reflective. Her affect was congruent and there were no indications of SI/HI/NSSI. Patient is finishing her active cancer treatment, which is bringing up a lot of thoughts and curiosities. The variance in thought is contributing to a variance in emotions. The concept of 'survivorship' a little daunting, as the patient has not figured out what it means to her. Patient is beginning to transition out of a primarily 'cancer patient' identity and moving back to student, getting ready for more schooling. Next session will continue to process any emotions that come up and further explore survivorship.   Gaylyn Rong Counseling Intern

## 2020-04-14 NOTE — Telephone Encounter (Signed)
Received faxed refill request from [facility] for Filgrastim (Zoladex) 480 mcg/0.8 ml injection:  Administer 480 mcg (1 syringe) of Zoladex [place] daily for 3 days starting 24h after each chemotherapy treatment every 14 days Dr. Irene Limbo completed/signed refill for 2 week supply with 2 refills Faxed to [phone]/TXM confirmation received

## 2020-04-14 NOTE — Patient Instructions (Signed)
Central Line, Adult A central line is a long, thin tube (catheter) that is put into a vein so that it goes to a large vein above your heart. It can be used to:  Give you medicine or fluids.  Give you food and nutrients.  Take blood or give you blood for testing or treatments. Types of central lines There are four main types of central lines:  Peripherally inserted central catheter (PICC) line. This type is usually put in the upper arm and goes up the arm to the heart.  Tunneled central line. This type is placed in a large vein in the neck, chest, or groin. It is tunneled under the skin and brought out through a second incision.  Non-tunneled central line. This type is used for a shorter time than other types, usually for 7 days at the most. It is inserted in the neck, chest, or groin.  Implanted port. This type can stay in place longer than other types of central lines. It is normally put in the upper chest but can also be placed in the upper arm or the belly. Surgery is needed to put it in and take it out. The type of central line you get will depend on how long you need it and your medical condition.   Tell a doctor about:  Any allergies you have.  All medicines you are taking. These include vitamins, herbs, eye drops, creams, and over-the-counter medicines.  Any problems you or family members have had with anesthetic medicines.  Any blood disorders you have.  Any surgeries you have had.  Any medical conditions you have.  Whether you are pregnant or may be pregnant. What are the risks? Generally, central lines are safe. However, problems may occur, including:  Infection.  A blood clot.  Bleeding from the place where the central line was inserted.  Getting a hole or crack in the central line. If this happens, the central line will need to be replaced.  Central line failure.  The catheter moving or coming out of place. What happens before the  procedure? Medicines  Ask your doctor about changing or stopping: ? Your normal medicines. ? Vitamins, herbs, and supplements. ? Over-the-counter medicines.  Do not take aspirin or ibuprofen unless you are told to. General instructions  Follow instructions from your doctor about eating or drinking.  For your safety, your doctor may: ? Mark the area of the procedure. ? Remove hair at the procedure site. ? Ask you to wash with a soap that kills germs.  Plan to have a responsible adult take you home from the hospital or clinic.  If you will be going home right after the procedure, plan to have a responsible adult care for you for the time you are told. This is important. What happens during the procedure?  An IV tube will be put into one of your veins.  You may be given: ? A sedative. This medicine helps you relax. ? Anesthetics. These medicines numb certain areas of your body.  Your skin will be cleaned with a germ-killing (antiseptic) solution. You may be covered with clean drapes.  Your blood pressure, heart rate, breathing rate, and blood oxygen level will be monitored during the procedure.  The central line will be put into the vein and moved through it to the correct spot. The doctor may use X-ray equipment to help guide the central line to the right place.  A bandage (dressing) will be placed over the insertion area.   The procedure may vary among doctors and hospitals. What can I expect after the procedure?  You will be monitored until you leave the hospital or clinic. This includes checking your blood pressure, heart rate, breathing rate, and blood oxygen level.  Caps may be placed on the ends of the central line tubing.  If you were given a sedative during your procedure, do not drive or use machines until your doctor says that it is safe. Follow these instructions at home: Caring for the tube  Follow instructions from your doctor about: ? Flushing the  tube. ? Cleaning the tube and the area around it.  Only use germ-free (sterile) supplies to flush. The supplies should be from your doctor, a pharmacy, or another place that your doctor recommends.  Before you flush the tube or clean the area around the tube: ? Wash your hands with soap and water for at least 20 seconds. If you cannot use soap and water, use hand sanitizer. ? Clean the central line hub with rubbing alcohol. To do this:  Scrub it using a twisting motion and rub for 10 to 15 seconds or for 30 twists. Follow the manufacturer's instructions.  Be sure you scrub the top of the hub, not just the sides. Never reuse alcohol pads.  Let the hub dry before use. Keep it from touching anything while drying.   Caring for your skin  Check the skin around the central line every day for signs of infection. Check for: ? Redness, swelling, or pain. ? Fluid or blood. ? Warmth. ? Pus or a bad smell.  Keep the area where the tube was put in clean and dry.  Change bandages only as told by your doctor.  Keep your bandage dry. If a bandage gets wet, have it changed right away. General instructions  Keep the tube clamped, unless it is being used.  If you or someone else accidentally pulls on the tube, make sure: ? The bandage is okay. ? There is no bleeding. ? The tube has not been pulled out.  Do not use scissors or sharp objects near the tube.  Do not take baths, swim, or use a hot tub until your doctor says it is okay. Ask your doctor if you may take showers. You may only be allowed to take sponge baths.  Ask your doctor what activities are safe for you. Your doctor may tell you not to lift anything or move your arm too much.  Take over-the-counter and prescription medicines only as told by your doctor.  Keep all follow-up visits. Storing and throwing away supplies  Keep your supplies in a clean, dry location.  Throw away any used syringes in a container that is only for  sharp items (sharps container). You can buy a sharps container from a pharmacy, or you can make one by using an empty hard plastic bottle with a cover.  Place any used bandages or infusion bags into a plastic bag. Throw that bag in the trash. Contact a doctor if:  You have any of these signs of infection where the tube was put in: ? Redness, swelling, or pain. ? Fluid or blood. ? Warmth. ? Pus or a bad smell. Get help right away if:  You have: ? A fever or chills. ? Shortness of breath. ? Pain in your chest. ? A fast heartbeat. ? Swelling in your neck, face, chest, or arm.  You feel dizzy or you faint.  There are red lines coming from   where the tube was put in.  The area where the tube was put in is bleeding and the bleeding will not stop.  Your tube is hard to flush.  You do not get a blood return from the tube.  The tube gets loose or comes out.  The tube has a hole or a tear.  The tube leaks. Summary  A central line is a long, thin tube (catheter) that is put in your vein. It can be used to give you medicine, food, or fluids.  Follow instructions from your doctor about flushing and cleaning the tube.  Keep the area where the tube was put in clean and dry.  Ask your doctor what activities are safe for you. This information is not intended to replace advice given to you by your health care provider. Make sure you discuss any questions you have with your health care provider. Document Revised: 11/05/2019 Document Reviewed: 11/05/2019 Elsevier Patient Education  2021 [employer]  

## 2020-04-14 NOTE — Patient Instructions (Signed)
[facility] Discharge Instructions for Patients Receiving Chemotherapy  Today you received the following chemotherapy agents: doxorubicin/vinblastine/dacarbazine.  To help prevent nausea and vomiting after your treatment, we encourage you to take your nausea medication as directed.   If you develop nausea and vomiting that is not controlled by your nausea medication, call the clinic.   BELOW ARE SYMPTOMS THAT SHOULD BE REPORTED IMMEDIATELY:  *FEVER GREATER THAN 100.5 F  *CHILLS WITH OR WITHOUT FEVER  NAUSEA AND VOMITING THAT IS NOT CONTROLLED WITH YOUR NAUSEA MEDICATION  *UNUSUAL SHORTNESS OF BREATH  *UNUSUAL BRUISING OR BLEEDING  TENDERNESS IN MOUTH AND THROAT WITH OR WITHOUT PRESENCE OF ULCERS  *URINARY PROBLEMS  *BOWEL PROBLEMS  UNUSUAL RASH Items with * indicate a potential emergency and should be followed up as soon as possible.  Feel free to call the clinic should you have any questions or concerns. The clinic phone number is [phone].  Please show the CHEMO ALERT CARD at check-in to the Emergency Department and triage nurse.   

## 2020-04-21 NOTE — Progress Notes (Signed)
[facility] and [facility] Counseling Note  Patient reported on her treatment from last week, saying recover wasn't as bad but the actual treatment was more distressing than usual. Counselor reminded about 4-7-8 breathing technique to use in situations of distress. Counselor and patient discussed what survivorship means and planned a project where patient will write about what she learned and can use this project to set this experience aside. Counselor provided a space to process feelings and normalized them. Counselor provided reflective listening, reframes, validation of feelings, and psychoeducation. Patient was oriented times three and showed no SI/HI/NSSI. Her mood was largely reflective and curious with an appropriate to context affect. Patient did laugh in some situations when talking about something hard. Patient is finishing her active cancer treatment, which is bringing up a lot of thoughts and curiosities. The variance in thought is contributing to a variance in emotions. The concept of 'survivorship' is a little daunting, as the patient has not figured out what it means to her. Patient is beginning to transition out of a primarily 'cancer patient' identity and moving back to student, getting ready for more schooling. Next session will address her 'project' and how to prepare for the final chemo.    Gaylyn Rong Counseling Intern

## 2020-04-26 NOTE — Progress Notes (Signed)
HEMATOLOGY/ONCOLOGY CONSULTATION NOTE  Date of Service: 04/26/2020  Patient Care Team: Pcp, No as PCP - General  CHIEF COMPLAINTS/PURPOSE OF CONSULTATION:  Nodular sclerosis classical Hodgkin lymphoma  HISTORY OF PRESENTING ILLNESS:   Sonia Doyle is a wonderful 25 y.o. female who has been referred to [place] by Dr. Fonda Kinder for evaluation and management of newly diagnosed nodular sclerosis CHL. Pt is accompanied today by her mother. The pt reports that she is doing well overall.   The pt reports that she has asthma and has had a wisdom tooth extraction and tonsillectomy. She is only using her inhaler as needed at this time. She has no other known medical issues or medication allergies. 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 She has a family history significant for skin cancers.   Pt noticed an enlarged right-sided supraclavicular lymph node, which lead to the work up. She has since noticed more enlarged lymph nodes, but also a reduction in the size of the lymph node that initially concerned her. She had a fever in June that was accompanied by chills and night sweats. Pt denies weight loss, but has had multiple itchy rashes.   Pt has not had a port placed yet, as she had to cancel her previous appointment. Pt is scheduled for a consultation at [facility] later today. She has been set up for ovum harvesting in about 7-10 days for fertility preservation. She is currently on Menopur.   Prior to her referral pt received one IV Iron test dose, which caused her to be hoarse. They did not attempt any further IV Iron infusions. She was told to continue PO Iron.   08/24/2019 US Soft Tissue revealed "Biliteral supraclavicular nodules with atypical featues for lymph nodes, consider further evaluation with enhanced CT of the chest, abdomen and pelvis for the presence of additional adenopathy".   09/03/2019 CT Chest revealed  "Anterior mediastinal, bilateral hilar and neck adenopathy most suggestive of Lymphoma".   10/09/2019 Right Cervical Lymph Node Biopsy revealed "Classic Hodgkin's lymphoma, nodular sclerosis type".   10/09/2019 Flow Cytometry Report revealed "Negative for B-cell lymphoproliferative disorder or aberrant T-cell immunophenotype"  10/27/2019 ECHO was normal  11/03/2019 Left Iliac Bone BM Bx revealed  "-Normocellular to slightly hypercellular marrow with trilineage hematopoiesis and adequate magakaryocytes - Negative for lymphoma or other form of malignancy".  11/05/2019 PET/CT revealed  "Hypermetabolic lymphadeopathy involving multiple nodal stations in the neck and chest, consistent with reported history of lymphoma."   Most recent lab results (10/20/2019) of CBC w/diff & CMP is as follows: all values are WNL except for WBC at 14.8K, PLT at 588K, MPV at 6.8, Neutro Abs at 12.08K, Glucose at 68, Albumin at 3.0, ALP at 147. 10/20/2019 LDH at 279 09/15/2019 Sed Rate at 73  On review of systems, pt reports fevers, chills, night sweats, rash, new lumps/bumps and denies unexpected weight loss, constipation, abdominal pain and any other symptoms.   On PMHx the pt reports Asthma, Tonsillectomy, Wisdom Tooth Extraction. On Social Hx the pt reports that she is a non-smoker and does not drink much alcohol outside of social situations.  On Family Hx the pt reports skin cancer.   INTERVAL HISTORY:   Sonia Doyle is a wonderful 25 y.o. female who is here for evaluation and management Nodular Sclerosing CHL. She is here for C6D1 of AVD. The patient's last visit with [place] was on 03/31/2020. The pt reports  that she is doing well overall.  The pt reports no new concerns or symptoms. She still has intermittent skin rashes, improved by Benadryl or another antihistamine. She notes that she experiences fluttering of her heart occasionally, a few days after treatment. This is not bothersome nor consistent.  Lab  results today 04/28/2020 of CBC w/diff and CMP is as follows: all values are WNL except for WBC of 2.6K, Hct of 35.2, Neutro abs of 0.9K. CMP is stable.  On review of systems, pt reports intermittent heart fluttering and denies SOB, chest pain, thrush/mouth sores, fevers, chills, night sweats, changes in bowel habits, decreased appetite, changes in sleep habits, and any other symptoms.  MEDICAL HISTORY:  Childhood Asthma - not requiring preventative asthma medications/inhalers at present time   SURGICAL HISTORY: Tonsillectomy (2003) Wisdom Tooth Extraction   SOCIAL HISTORY: Social History   Socioeconomic History  . Marital status: Single    Spouse name: Not on file  . Number of children: Not on file  . Years of education: Not on file  . Highest education level: Not on file  Occupational History  . Not on file  Tobacco Use  . Smoking status: Never Smoker  . Smokeless tobacco: Never Used  Substance and Sexual Activity  . Alcohol use: Not on file  . Drug use: Not on file  . Sexual activity: Not on file  Other Topics Concern  . Not on file  Social History Narrative  . Not on file   Social Determinants of Health   Financial Resource Strain: Not on file  Food Insecurity: Not on file  Transportation Needs: Not on file  Physical Activity: Not on file  Stress: Not on file  Social Connections: Not on file  Intimate Partner Violence: Not on file    FAMILY HISTORY: Hypothyroidism - Mother  Atrial fibrillation - Maternal Grandmother Diabetes type II - Paternal Grandmother  Dementia - Paternal Grandmother   ALLERGIES:  has No Known Allergies.  MEDICATIONS:  Current Outpatient Medications  Medication Sig Dispense Refill  . albuterol (VENTOLIN HFA) 108 (90 Base) MCG/ACT inhaler Inhale 1 puff into the lungs as needed for wheezing or shortness of breath. 1 each 0  . dexamethasone (DECADRON) 4 MG tablet Take 2 tablets by mouth once a day for 3 days after chemotherapy. Take with  food. 30 tablet 1  . ferrous sulfate 325 (65 FE) MG tablet Take 325 mg by mouth daily with breakfast.    . filgrastim-sndz (ZARXIO) 480 MCG/0.8ML SOSY injection Administer 480 mcg (1 syringe) of Zarxio [place] daily for 3 days starting 24h after each chemotherapy treatment every 14 days 2.4 mL 2  . fluticasone (FLOVENT HFA) 110 MCG/ACT inhaler Inhale 1 puff into the lungs as needed. 1 each 0  . fluticasone (FLOVENT HFA) 44 MCG/ACT inhaler Inhale into the lungs.    Marland Kitchen levofloxacin (LEVAQUIN) 500 MG tablet Take 1 tablet (500 mg total) by mouth daily. (Patient not taking: Reported on 03/31/2020) 5 tablet 0  . lidocaine-prilocaine (EMLA) cream Apply to affected area once 30 g 3  . loratadine (CLARITIN) 10 MG tablet Take by mouth.    Marland Kitchen LORazepam (ATIVAN) 0.5 MG tablet Take 1 tablet (0.5 mg total) by mouth every 6 (six) hours as needed (Nausea or vomiting). 30 tablet 0  . Norgestimate-Ethinyl Estradiol Triphasic 0.18/0.215/0.25 MG-25 MCG tab Take 1 tablet by mouth daily.    . ondansetron (ZOFRAN) 8 MG tablet Take 1 tablet (8 mg total) by mouth 2 (two) times daily as  needed. Start on the third day after chemotherapy. 30 tablet 1  . prochlorperazine (COMPAZINE) 10 MG tablet Take 1 tablet (10 mg total) by mouth every 6 (six) hours as needed (Nausea or vomiting). 30 tablet 1  . traMADol (ULTRAM) 50 MG tablet Take 50-100 mg by mouth every 6 (six) hours as needed.     No current facility-administered medications for this visit.    REVIEW OF SYSTEMS:   10 Point review of Systems was done is negative except as noted above.  PHYSICAL EXAMINATION: ECOG PERFORMANCE STATUS: 1 - Symptomatic but completely ambulatory  .BP 126/67 (BP Location: Left Arm, Patient Position: Sitting)   Pulse 92   Temp 97.6 F (36.4 C) (Tympanic)   Resp 18   Ht 5\' 10"  (1.778 m)   Wt 146 lb 11.2 oz (66.5 kg)   SpO2 100%   BMI 21.05 kg/m    GENERAL:alert, in no acute distress and comfortable SKIN: no acute rashes, no significant  lesions EYES: conjunctiva are pink and non-injected, sclera anicteric OROPHARYNX: MMM, no exudates, no oropharyngeal erythema or ulceration NECK: supple, no JVD LYMPH:  no palpable lymphadenopathy in the cervical, axillary or inguinal regions LUNGS: clear to auscultation b/l with normal respiratory effort HEART: regular rate & rhythm ABDOMEN:  normoactive bowel sounds , non tender, not distended. Extremity: no pedal edema PSYCH: alert & oriented x 3 with fluent speech NEURO: no focal motor/sensory deficits  LABORATORY DATA:  I have reviewed the data as listed  CBC Latest Ref Rng & Units 04/28/2020 04/14/2020 03/31/2020  WBC 4.0 - 10.5 K/uL 2.6(L) 4.4 4.0  Hemoglobin 12.0 - 15.0 g/dL 12.0 12.4 12.3  Hematocrit 36.0 - 46.0 % 35.2(L) 35.4(L) 35.2(L)  Platelets 150 - 400 K/uL 261 336 376   ANC 1000  CMP Latest Ref Rng & Units 04/28/2020 04/14/2020 03/31/2020  Glucose 70 - 99 mg/dL 112(H) 97 89  BUN 6 - 20 mg/dL 11 9 11   Creatinine 0.44 - 1.00 mg/dL 0.77 0.75 0.82  Sodium 135 - 145 mmol/L 141 141 140  Potassium 3.5 - 5.1 mmol/L 3.6 4.1 4.0  Chloride 98 - 111 mmol/L 109 110 108  CO2 22 - 32 mmol/L 24 24 26   Calcium 8.9 - 10.3 mg/dL 9.1 9.3 9.6  Total Protein 6.5 - 8.1 g/dL 6.1(L) 6.7 6.6  Total Bilirubin 0.3 - 1.2 mg/dL 0.3 0.4 0.6  Alkaline Phos 38 - 126 U/L 50 59 58  AST 15 - 41 U/L 14(L) 17 17  ALT 0 - 44 U/L 8 9 11     RADIOGRAPHIC STUDIES: I have personally reviewed the radiological images as listed and agreed with the findings in the report. No results found.  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  -11/05/2019 PET/CT revealed  "Hypermetabolic lymphadeopathy involving multiple nodal stations in the neck and chest, consistent with reported history of lymphoma."   -01/20/2020 PET/CT ([id number]) revealed "1. No findings of pathologically enlarged or hypermetabolic adenopathy." 2) Anemia of chronic disease. 3) chemotherapy related leukopenia/neutropenia - resolved . On G-CSF  PLAN: -Discussed pt labwork, 04/28/2020; WBC slightly lower but expected at this point of treatment, other counts normal, chemistries pending. -Recommend pt continue to increase electrolyte intake. -Discussed immune system recovery following chemotherapy. Advised pt the antibody immunity will progress to 100% in 6 months. The T-cell immunity will take  around 2 years to reach 100%. -Advised pt no live viral vaccines two years following treatment completion. Warned against flu mist, chicken pox vaccine. -Discussed traveling and flying following treatment. Recommended exercising caution, wearing mask, and weighing risks versus rewards balancing act. -Discussed pre-exposure prophylaxis and advised pt to see if she is a candidate for this. -Advised pt her last treatment is 05/11/2020. Will have PET one month following. -The pt has no overt prohibitive toxicities from continuing C6D1 AVD chemotherapy at this time. We will proceed with C6D15 with same doses of chemotherapy and same premedications. -Continue 3 days of filgrastim at home after each treatment. -Refill Filgrastin and Decadron. -Will see back in 6 weeks following PET scan.  FOLLOW UP: Follow-up as scheduled for cycle 6-day 15 as planned in 2 weeks PET CT scan in 5 weeks Return to clinic with Dr. Irene Limbo with port flush and labs in 6 weeks   The total time spent in the appointment was 20 minutes and more than 50% was on counseling and direct patient cares.  All of the patient's questions were answered with apparent satisfaction. The patient knows to call the clinic with any problems, questions or concerns.    Sullivan Lone MD [place] [facility] Hematology/Oncology Physician [facility]  (Office):        [phone] (Work cell):  [phone] (Fax):           [phone]  04/26/2020 11:17 AM   I, Sonia Doyle, am acting as scribe for Dr. Sullivan Lone, MD.   .I have reviewed the above documentation for accuracy and completeness, and I agree with the above. Sonia Genera MD

## 2020-04-27 ENCOUNTER — Telehealth: Payer: Self-pay | Admitting: Hematology

## 2020-04-27 NOTE — Telephone Encounter (Signed)
Called patient regarding upcoming 02/10 and 02/23 appointment, left a voicemail.

## 2020-04-28 ENCOUNTER — Inpatient Hospital Stay: Payer: 59

## 2020-04-28 ENCOUNTER — Other Ambulatory Visit: Payer: Self-pay

## 2020-04-28 ENCOUNTER — Telehealth: Payer: Self-pay | Admitting: *Deleted

## 2020-04-28 ENCOUNTER — Inpatient Hospital Stay (HOSPITAL_BASED_OUTPATIENT_CLINIC_OR_DEPARTMENT_OTHER): Payer: 59 | Admitting: Hematology

## 2020-04-28 ENCOUNTER — Inpatient Hospital Stay: Payer: 59 | Attending: Hematology

## 2020-04-28 VITALS — BP 126/67 | HR 92 | Temp 97.6°F | Resp 18 | Ht 70.0 in | Wt 146.7 lb

## 2020-04-28 DIAGNOSIS — D630 Anemia in neoplastic disease: Secondary | ICD-10-CM | POA: Diagnosis not present

## 2020-04-28 DIAGNOSIS — Z5111 Encounter for antineoplastic chemotherapy: Secondary | ICD-10-CM | POA: Insufficient documentation

## 2020-04-28 DIAGNOSIS — C8111 Nodular sclerosis classical Hodgkin lymphoma, lymph nodes of head, face, and neck: Secondary | ICD-10-CM | POA: Diagnosis present

## 2020-04-28 DIAGNOSIS — C8118 Nodular sclerosis classical Hodgkin lymphoma, lymph nodes of multiple sites: Secondary | ICD-10-CM

## 2020-04-28 DIAGNOSIS — D702 Other drug-induced agranulocytosis: Secondary | ICD-10-CM

## 2020-04-28 DIAGNOSIS — Z7189 Other specified counseling: Secondary | ICD-10-CM | POA: Diagnosis not present

## 2020-04-28 DIAGNOSIS — Z95828 Presence of other vascular implants and grafts: Secondary | ICD-10-CM

## 2020-04-28 LAB — CMP (CANCER CENTER ONLY)
ALT: 8 U/L (ref 0–44)
AST: 14 U/L — ABNORMAL LOW (ref 15–41)
Albumin: 3.9 g/dL (ref 3.5–5.0)
Alkaline Phosphatase: 50 U/L (ref 38–126)
Anion gap: 8 (ref 5–15)
BUN: 11 mg/dL (ref 6–20)
CO2: 24 mmol/L (ref 22–32)
Calcium: 9.1 mg/dL (ref 8.9–10.3)
Chloride: 109 mmol/L (ref 98–111)
Creatinine: 0.77 mg/dL (ref 0.44–1.00)
GFR, Estimated: >60 mL/min (ref >60)
Glucose, Bld: 112 mg/dL — ABNORMAL HIGH (ref 70–99)
Potassium: 3.6 mmol/L (ref 3.5–5.1)
Sodium: 141 mmol/L (ref 135–145)
Total Bilirubin: 0.3 mg/dL (ref 0.3–1.2)
Total Protein: 6.1 g/dL — ABNORMAL LOW (ref 6.5–8.1)

## 2020-04-28 LAB — CBC WITH DIFFERENTIAL/PLATELET
Abs Immature Granulocytes: 0.03 10*3/uL (ref 0.00–0.07)
Basophils Absolute: 0 10*3/uL (ref 0.0–0.1)
Basophils Relative: 2 %
Eosinophils Absolute: 0.1 10*3/uL (ref 0.0–0.5)
Eosinophils Relative: 2 %
HCT: 35.2 % — ABNORMAL LOW (ref 36.0–46.0)
Hemoglobin: 12 g/dL (ref 12.0–15.0)
Immature Granulocytes: 1 %
Lymphocytes Relative: 51 %
Lymphs Abs: 1.3 10*3/uL (ref 0.7–4.0)
MCH: 31 pg (ref 26.0–34.0)
MCHC: 34.1 g/dL (ref 30.0–36.0)
MCV: 91 fL (ref 80.0–100.0)
Monocytes Absolute: 0.3 10*3/uL (ref 0.1–1.0)
Monocytes Relative: 11 %
Neutro Abs: 0.9 10*3/uL — ABNORMAL LOW (ref 1.7–7.7)
Neutrophils Relative %: 33 %
Platelets: 261 10*3/uL (ref 150–400)
RBC: 3.87 MIL/uL (ref 3.87–5.11)
RDW: 13.2 % (ref 11.5–15.5)
WBC: 2.6 10*3/uL — ABNORMAL LOW (ref 4.0–10.5)
nRBC: 0 % (ref 0.0–0.2)

## 2020-04-28 MED ORDER — LORAZEPAM 2 MG/ML IJ SOLN
0.5000 mg | Freq: Once | INTRAMUSCULAR | Status: AC
  Administered 2020-04-28: 0.5 mg via INTRAVENOUS

## 2020-04-28 MED ORDER — SODIUM CHLORIDE 0.9% FLUSH
10.0000 mL | INTRAVENOUS | Status: DC | PRN
  Administered 2020-04-28: 10 mL
  Filled 2020-04-28: qty 10

## 2020-04-28 MED ORDER — ZARXIO 480 MCG/0.8ML IJ SOSY: PREFILLED_SYRINGE | INTRAMUSCULAR | 1 refills | Status: DC

## 2020-04-28 MED ORDER — SODIUM CHLORIDE 0.9 % IV SOLN
375.0000 mg/m2 | Freq: Once | INTRAVENOUS | Status: AC
  Administered 2020-04-28: 710 mg via INTRAVENOUS
  Filled 2020-04-28: qty 71

## 2020-04-28 MED ORDER — HEPARIN SOD (PORK) LOCK FLUSH 100 UNIT/ML IV SOLN
500.0000 [IU] | Freq: Once | INTRAVENOUS | Status: AC | PRN
  Administered 2020-04-28: 500 [IU]
  Filled 2020-04-28: qty 5

## 2020-04-28 MED ORDER — DOXORUBICIN HCL CHEMO IV INJECTION 2 MG/ML
25.0000 mg/m2 | Freq: Once | INTRAVENOUS | Status: AC
  Administered 2020-04-28: 48 mg via INTRAVENOUS
  Filled 2020-04-28: qty 24

## 2020-04-28 MED ORDER — SODIUM CHLORIDE 0.9 % IV SOLN
16.0000 mg | Freq: Once | INTRAVENOUS | Status: AC
  Administered 2020-04-28: 16 mg via INTRAVENOUS
  Filled 2020-04-28: qty 8

## 2020-04-28 MED ORDER — LORAZEPAM 2 MG/ML IJ SOLN
INTRAMUSCULAR | Status: AC
  Filled 2020-04-28: qty 1

## 2020-04-28 MED ORDER — SODIUM CHLORIDE 0.9 % IV SOLN
Freq: Once | INTRAVENOUS | Status: AC
  Filled 2020-04-28: qty 250

## 2020-04-28 MED ORDER — SODIUM CHLORIDE 0.9 % IV SOLN
150.0000 mg | Freq: Once | INTRAVENOUS | Status: AC
  Administered 2020-04-28: 150 mg via INTRAVENOUS
  Filled 2020-04-28: qty 150

## 2020-04-28 MED ORDER — VINBLASTINE SULFATE CHEMO INJECTION 1 MG/ML
5.8000 mg/m2 | Freq: Once | INTRAVENOUS | Status: AC
  Administered 2020-04-28: 11 mg via INTRAVENOUS
  Filled 2020-04-28: qty 11

## 2020-04-28 MED ORDER — SODIUM CHLORIDE 0.9 % IV SOLN
12.0000 mg | Freq: Once | INTRAVENOUS | Status: AC
  Administered 2020-04-28: 12 mg via INTRAVENOUS
  Filled 2020-04-28: qty 1.2

## 2020-04-28 MED ORDER — DEXAMETHASONE 4 MG PO TABS: ORAL_TABLET | ORAL | 0 refills | Status: DC

## 2020-04-28 NOTE — Progress Notes (Signed)
Per Dr. Irene Limbo, ok to proceed with treatment with ANC of 0.9.

## 2020-04-28 NOTE — Patient Instructions (Signed)
[place] Discharge Instructions for Patients Receiving Chemotherapy  Today you received the following chemotherapy agents: doxorubicin/vinblastine/dacarbazine.  To help prevent nausea and vomiting after your treatment, we encourage you to take your nausea medication as directed.   If you develop nausea and vomiting that is not controlled by your nausea medication, call the clinic.   BELOW ARE SYMPTOMS THAT SHOULD BE REPORTED IMMEDIATELY:  *FEVER GREATER THAN 100.5 F  *CHILLS WITH OR WITHOUT FEVER  NAUSEA AND VOMITING THAT IS NOT CONTROLLED WITH YOUR NAUSEA MEDICATION  *UNUSUAL SHORTNESS OF BREATH  *UNUSUAL BRUISING OR BLEEDING  TENDERNESS IN MOUTH AND THROAT WITH OR WITHOUT PRESENCE OF ULCERS  *URINARY PROBLEMS  *BOWEL PROBLEMS  UNUSUAL RASH Items with * indicate a potential emergency and should be followed up as soon as possible.  Feel free to call the clinic should you have any questions or concerns. The clinic phone number is [phone].  Please show the [place] at check-in to the Emergency Department and triage nurse.

## 2020-04-28 NOTE — Telephone Encounter (Signed)
Patient refill for 480 mcg (1 syringe) of Zarxio [place] daily for 3 days starting 24h after each chemotherapy treatment every 14 days was faxed to [facility] [place] in error. [facility] as it was last pharmacy used for this med. Patient is not active with [facility] per Karleen Hampshire (rep). He states her account shows she is active with [facility]. [phone], spoke with  Rep Vivien Rota - she confirmed that patient has [facility] and is acitve with  [facility]. Chart information corrected to [facility]> Prescription for Zarxio escribed to [facility].

## 2020-05-05 NOTE — Progress Notes (Signed)
[place] Patient and [facility] Counseling Note  Patient has one more chemo tx and reported feeling excited to finish it and that she would miss the people and community she had come to know. Counselor and patient discussed patient's interactions with her friends, vulnerability and roleplayed talking to them. Counselor and patient revisited the topic of her writing project and identified themes from her writings and experiences. 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 Counselor posed the question "How do we keep from shoving it away?" Throughout session, counselor provided normalization of feelings, reflective listening, and psychoeducation. Patient was oriented times three. Her mood was largely excited, reflective, and curious, with an appropriate to context affect. There were no indications of SI/HI/NSSI. Patient is finishing her active cancer treatment, which is bringing up a lot of thoughts and curiosities. The variance in thought is contributing to a variance in emotions. The concept of 'survivorship' is a little daunting, as the patient has not figured out what it means to her. Patient is beginning to transition out of a primarily 'cancer patient' identity and moving back to student, getting ready for more schooling. Next session will discuss her final treatment, her writing project, and revisit survivor's guilt.     Gaylyn Rong Counseling Intern

## 2020-05-09 ENCOUNTER — Other Ambulatory Visit: Payer: 59

## 2020-05-09 ENCOUNTER — Ambulatory Visit: Payer: 59

## 2020-05-11 ENCOUNTER — Other Ambulatory Visit: Payer: Self-pay

## 2020-05-11 ENCOUNTER — Inpatient Hospital Stay: Payer: 59

## 2020-05-11 VITALS — BP 116/75 | HR 77 | Temp 98.5°F | Resp 18 | Ht 70.0 in | Wt 147.8 lb

## 2020-05-11 DIAGNOSIS — Z5111 Encounter for antineoplastic chemotherapy: Secondary | ICD-10-CM | POA: Diagnosis not present

## 2020-05-11 DIAGNOSIS — C8118 Nodular sclerosis classical Hodgkin lymphoma, lymph nodes of multiple sites: Secondary | ICD-10-CM

## 2020-05-11 DIAGNOSIS — Z95828 Presence of other vascular implants and grafts: Secondary | ICD-10-CM

## 2020-05-11 DIAGNOSIS — Z7189 Other specified counseling: Secondary | ICD-10-CM

## 2020-05-11 LAB — CMP (CANCER CENTER ONLY)
ALT: 8 U/L (ref 0–44)
AST: 14 U/L — ABNORMAL LOW (ref 15–41)
Albumin: 3.9 g/dL (ref 3.5–5.0)
Alkaline Phosphatase: 54 U/L (ref 38–126)
Anion gap: 9 (ref 5–15)
BUN: 9 mg/dL (ref 6–20)
CO2: 23 mmol/L (ref 22–32)
Calcium: 8.8 mg/dL — ABNORMAL LOW (ref 8.9–10.3)
Chloride: 109 mmol/L (ref 98–111)
Creatinine: 0.73 mg/dL (ref 0.44–1.00)
GFR, Estimated: >60 mL/min (ref >60)
Glucose, Bld: 98 mg/dL (ref 70–99)
Potassium: 3.8 mmol/L (ref 3.5–5.1)
Sodium: 141 mmol/L (ref 135–145)
Total Bilirubin: <0.2 mg/dL — ABNORMAL LOW (ref 0.3–1.2)
Total Protein: 6.1 g/dL — ABNORMAL LOW (ref 6.5–8.1)

## 2020-05-11 LAB — CBC WITH DIFFERENTIAL/PLATELET
Abs Immature Granulocytes: 0.04 10*3/uL (ref 0.00–0.07)
Basophils Absolute: 0 10*3/uL (ref 0.0–0.1)
Basophils Relative: 1 %
Eosinophils Absolute: 0.1 10*3/uL (ref 0.0–0.5)
Eosinophils Relative: 2 %
HCT: 34.4 % — ABNORMAL LOW (ref 36.0–46.0)
Hemoglobin: 11.8 g/dL — ABNORMAL LOW (ref 12.0–15.0)
Immature Granulocytes: 1 %
Lymphocytes Relative: 45 %
Lymphs Abs: 1.3 10*3/uL (ref 0.7–4.0)
MCH: 31.1 pg (ref 26.0–34.0)
MCHC: 34.3 g/dL (ref 30.0–36.0)
MCV: 90.5 fL (ref 80.0–100.0)
Monocytes Absolute: 0.6 10*3/uL (ref 0.1–1.0)
Monocytes Relative: 20 %
Neutro Abs: 0.9 10*3/uL — ABNORMAL LOW (ref 1.7–7.7)
Neutrophils Relative %: 31 %
Platelets: 296 10*3/uL (ref 150–400)
RBC: 3.8 MIL/uL — ABNORMAL LOW (ref 3.87–5.11)
RDW: 13.3 % (ref 11.5–15.5)
WBC: 2.9 10*3/uL — ABNORMAL LOW (ref 4.0–10.5)
nRBC: 0 % (ref 0.0–0.2)

## 2020-05-11 MED ORDER — HEPARIN SOD (PORK) LOCK FLUSH 100 UNIT/ML IV SOLN
500.0000 [IU] | Freq: Once | INTRAVENOUS | Status: AC | PRN
  Administered 2020-05-11: 500 [IU]
  Filled 2020-05-11: qty 5

## 2020-05-11 MED ORDER — VINBLASTINE SULFATE CHEMO INJECTION 1 MG/ML
5.8000 mg/m2 | Freq: Once | INTRAVENOUS | Status: AC
  Administered 2020-05-11: 11 mg via INTRAVENOUS
  Filled 2020-05-11: qty 11

## 2020-05-11 MED ORDER — SODIUM CHLORIDE 0.9 % IV SOLN
Freq: Once | INTRAVENOUS | Status: AC
  Filled 2020-05-11: qty 250

## 2020-05-11 MED ORDER — SODIUM CHLORIDE 0.9 % IV SOLN
12.0000 mg | Freq: Once | INTRAVENOUS | Status: AC
Start: 2020-05-11 — End: 2020-05-11
  Administered 2020-05-11: 12 mg via INTRAVENOUS
  Filled 2020-05-11: qty 1.2

## 2020-05-11 MED ORDER — SODIUM CHLORIDE 0.9 % IV SOLN
150.0000 mg | Freq: Once | INTRAVENOUS | Status: AC
  Administered 2020-05-11: 150 mg via INTRAVENOUS
  Filled 2020-05-11: qty 150

## 2020-05-11 MED ORDER — SODIUM CHLORIDE 0.9 % IV SOLN
16.0000 mg | Freq: Once | INTRAVENOUS | Status: AC
  Administered 2020-05-11: 16 mg via INTRAVENOUS
  Filled 2020-05-11: qty 8

## 2020-05-11 MED ORDER — SODIUM CHLORIDE 0.9% FLUSH
10.0000 mL | INTRAVENOUS | Status: DC | PRN
  Administered 2020-05-11: 10 mL
  Filled 2020-05-11: qty 10

## 2020-05-11 MED ORDER — SODIUM CHLORIDE 0.9 % IV SOLN
375.0000 mg/m2 | Freq: Once | INTRAVENOUS | Status: AC
  Administered 2020-05-11: 710 mg via INTRAVENOUS
  Filled 2020-05-11: qty 71

## 2020-05-11 MED ORDER — LORAZEPAM 2 MG/ML IJ SOLN
INTRAMUSCULAR | Status: AC
  Filled 2020-05-11: qty 1

## 2020-05-11 MED ORDER — DOXORUBICIN HCL CHEMO IV INJECTION 2 MG/ML
25.0000 mg/m2 | Freq: Once | INTRAVENOUS | Status: AC
  Administered 2020-05-11: 48 mg via INTRAVENOUS
  Filled 2020-05-11: qty 24

## 2020-05-11 MED ORDER — LORAZEPAM 2 MG/ML IJ SOLN
0.5000 mg | Freq: Once | INTRAMUSCULAR | Status: AC
  Administered 2020-05-11: 0.5 mg via INTRAVENOUS

## 2020-05-11 NOTE — Patient Instructions (Signed)
[place] Discharge Instructions for Patients Receiving Chemotherapy  Today you received the following chemotherapy agents Doxorubicin (ADRIAMYCIN), Vinblastine (VELBAN) & Dacarbazine (DTIC).  To help prevent nausea and vomiting after your treatment, we encourage you to take your nausea medication as prescribed.  If you develop nausea and vomiting that is not controlled by your nausea medication, call the clinic.   BELOW ARE SYMPTOMS THAT SHOULD BE REPORTED IMMEDIATELY:  *FEVER GREATER THAN 100.5 F  *CHILLS WITH OR WITHOUT FEVER  NAUSEA AND VOMITING THAT IS NOT CONTROLLED WITH YOUR NAUSEA MEDICATION  *UNUSUAL SHORTNESS OF BREATH  *UNUSUAL BRUISING OR BLEEDING  TENDERNESS IN MOUTH AND THROAT WITH OR WITHOUT PRESENCE OF ULCERS  *URINARY PROBLEMS  *BOWEL PROBLEMS  UNUSUAL RASH Items with * indicate a potential emergency and should be followed up as soon as possible.  Feel free to call the clinic should you have any questions or concerns. The clinic phone number is [phone].  Please show the [place] at check-in to the Emergency Department and triage nurse.

## 2020-05-11 NOTE — Progress Notes (Signed)
Verbal order per Dr.Kale - Ok to receive treatment today with ANC 0.9

## 2020-05-11 NOTE — Patient Instructions (Signed)
Implanted Port Insertion, Care After This sheet gives you information about how to care for yourself after your procedure. Your health care provider may also give you more specific instructions. If you have problems or questions, contact your health care provider. What can I expect after the procedure? After the procedure, it is common to have:  Discomfort at the port insertion site.  Bruising on the skin over the port. This should improve over 3-4 days. Follow these instructions at home: Port care  After your port is placed, you will get a manufacturer's information card. The card has information about your port. Keep this card with you at all times.  Take care of the port as told by your health care provider. Ask your health care provider if you or a family member can get training for taking care of the port at home. A home health care nurse may also take care of the port.  Make sure to remember what type of port you have. Incision care  Follow instructions from your health care provider about how to take care of your port insertion site. Make sure you: ? Wash your hands with soap and water before and after you change your bandage (dressing). If soap and water are not available, use hand sanitizer. ? Change your dressing as told by your health care provider. ? Leave stitches (sutures), skin glue, or adhesive strips in place. These skin closures may need to stay in place for 2 weeks or longer. If adhesive strip edges start to loosen and curl up, you may trim the loose edges. Do not remove adhesive strips completely unless your health care provider tells you to do that.  Check your port insertion site every day for signs of infection. Check for: ? Redness, swelling, or pain. ? Fluid or blood. ? Warmth. ? Pus or a bad smell.      Activity  Return to your normal activities as told by your health care provider. Ask your health care provider what activities are safe for you.  Do not  lift anything that is heavier than 10 lb (4.5 kg), or the limit that you are told, until your health care provider says that it is safe. General instructions  Take over-the-counter and prescription medicines only as told by your health care provider.  Do not take baths, swim, or use a hot tub until your health care provider approves. Ask your health care provider if you may take showers. You may only be allowed to take sponge baths.  Do not drive for 24 hours if you were given a sedative during your procedure.  Wear a medical alert bracelet in case of an emergency. This will tell any health care providers that you have a port.  Keep all follow-up visits as told by your health care provider. This is important. Contact a health care provider if:  You cannot flush your port with saline as directed, or you cannot draw blood from the port.  You have a fever or chills.  You have redness, swelling, or pain around your port insertion site.  You have fluid or blood coming from your port insertion site.  Your port insertion site feels warm to the touch.  You have pus or a bad smell coming from the port insertion site. Get help right away if:  You have chest pain or shortness of breath.  You have bleeding from your port that you cannot control. Summary  Take care of the port as told by your   health care provider. Keep the manufacturer's information card with you at all times.  Change your dressing as told by your health care provider.  Contact a health care provider if you have a fever or chills or if you have redness, swelling, or pain around your port insertion site.  Keep all follow-up visits as told by your health care provider. This information is not intended to replace advice given to you by your health care provider. Make sure you discuss any questions you have with your health care provider. Document Revised: 10/01/2017 Document Reviewed: 10/01/2017 Elsevier Patient Education   2021 Elsevier Inc.  

## 2020-05-19 ENCOUNTER — Encounter: Payer: Self-pay | Admitting: Hematology

## 2020-05-19 NOTE — Progress Notes (Deleted)
The counselor provided active listening, a safe space to process emotions, and reflections.

## 2020-05-19 NOTE — Progress Notes (Addendum)
[place] Patient and [facility] Counseling Note  The patient and counselor discussed being done with treatment and being in this waiting period between last treatment and her last meeting with the doctor. The patient has been writing on her imposter syndrome/survivor's guilt. The counselor listened to the patient read it and allowed a safe space to cry. The patient said she "should feel happy" about her relatively 'easy' experience and has decided she wants to just be present with her self and understand her experience. The counselor suggested ACT and provided psychoeducation. The counselor and patient collaborated on including ACT in treatment. The counselor and patient further discussed the survivorship workbook and her writing project. The counselor provided active listening, a safe space to process emotions, and reflections. The patient was oriented times three and in a mostly happy mood. Her affect was appropriate to context. There were no signs of SI/HI/NSSI. Patient is finishing her active cancer treatment, which is bringing up a lot of thoughts and curiosities. The variance in thought is contributing to a variance in emotions. The concept of 'survivorship'isa little daunting, as the patient has not figured out what it means to her. Patient is beginning to transition out of a primarily 'cancer patient' identity and moving back to student, getting ready for more schooling. Next session will introduce mindfulness and acceptance activities.  Gaylyn Rong Counseling Intern

## 2020-05-20 ENCOUNTER — Other Ambulatory Visit: Payer: Self-pay | Admitting: Hematology

## 2020-05-20 DIAGNOSIS — D702 Other drug-induced agranulocytosis: Secondary | ICD-10-CM

## 2020-05-20 DIAGNOSIS — C8118 Nodular sclerosis classical Hodgkin lymphoma, lymph nodes of multiple sites: Secondary | ICD-10-CM

## 2020-05-26 NOTE — Progress Notes (Signed)
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 The counselor provided reframes and metaphors throughout this discussion. The patient also discussed survivorship briefly, largely in conjunction with her schooling. The counselor introduced "leaves on a stream" and practiced with the patient. The counselor and patient processed this experience, as well as updated about writing. The counselor provided empathic listening, validation of feelings, and a safe space to process. The patient was oriented times three in session. The patient's mood was mostly happy with an appropriate to context affect. There were no signs of SI/HI/NSSI. Patient is finishing her active cancer treatment, which is bringing up a lot of thoughts and curiosities. The variance in thought is contributing to a variance in emotions. The concept of 'survivorship'isa little daunting, as the patient has not figured out what it means to her. Patient is beginning to transition out of a primarily 'cancer patient' identity and moving back to student, getting ready for more schooling. Next session will include updates, continuing acceptance, and continuing to explore survivorship.  Gaylyn Rong Counseling Intern

## 2020-06-08 ENCOUNTER — Ambulatory Visit (HOSPITAL_COMMUNITY): Payer: 59

## 2020-06-08 ENCOUNTER — Ambulatory Visit
Admission: RE | Admit: 2020-06-08 | Discharge: 2020-06-08 | Disposition: A | Discharge status: Home / Self Care | Payer: 59 | Source: Ambulatory Visit | Attending: Hematology | Admitting: Hematology

## 2020-06-08 ENCOUNTER — Other Ambulatory Visit: Payer: Self-pay

## 2020-06-08 DIAGNOSIS — C8118 Nodular sclerosis classical Hodgkin lymphoma, lymph nodes of multiple sites: Secondary | ICD-10-CM | POA: Diagnosis not present

## 2020-06-08 LAB — GLUCOSE, CAPILLARY: Glucose-Capillary: 75 mg/dL (ref 70–99)

## 2020-06-08 MED ORDER — FLUDEOXYGLUCOSE F - 18 (FDG) INJECTION
7.6000 | Freq: Once | INTRAVENOUS | Status: AC
  Administered 2020-06-08: 7.83 via INTRAVENOUS

## 2020-06-08 NOTE — Progress Notes (Signed)
HEMATOLOGY/ONCOLOGY CLINIC NOTE  Date of Service: 06/09/2020  Patient Care Team: Pcp, No as PCP - General  CHIEF COMPLAINTS/PURPOSE OF CONSULTATION:  Nodular sclerosis classical Hodgkin lymphoma  HISTORY OF PRESENTING ILLNESS:   Sonia Doyle is a wonderful 25 y.o. female who has been referred to [place] by Dr. Fonda Kinder for evaluation and management of newly diagnosed nodular sclerosis CHL. Pt is accompanied today by her mother. The pt reports that she is doing well overall.   The pt reports that she has asthma and has had a wisdom tooth extraction and tonsillectomy. She is only using her inhaler as needed at this time. She has no other known medical issues or medication allergies. 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 She has a family history significant for skin cancers.   Pt noticed an enlarged right-sided supraclavicular lymph node, which lead to the work up. She has since noticed more enlarged lymph nodes, but also a reduction in the size of the lymph node that initially concerned her. She had a fever in June that was accompanied by chills and night sweats. Pt denies weight loss, but has had multiple itchy rashes.   Pt has not had a port placed yet, as she had to cancel her previous appointment. Pt is scheduled for a consultation at [facility] later today. She has been set up for ovum harvesting in about 7-10 days for fertility preservation. She is currently on Menopur.   Prior to her referral pt received one IV Iron test dose, which caused her to be hoarse. They did not attempt any further IV Iron infusions. She was told to continue PO Iron.   08/24/2019 US Soft Tissue revealed "Biliteral supraclavicular nodules with atypical featues for lymph nodes, consider further evaluation with enhanced CT of the chest, abdomen and pelvis for the presence of additional adenopathy".   09/03/2019 CT Chest revealed "Anterior  mediastinal, bilateral hilar and neck adenopathy most suggestive of Lymphoma".   10/09/2019 Right Cervical Lymph Node Biopsy revealed "Classic Hodgkin's lymphoma, nodular sclerosis type".   10/09/2019 Flow Cytometry Report revealed "Negative for B-cell lymphoproliferative disorder or aberrant T-cell immunophenotype"  10/27/2019 ECHO was normal  11/03/2019 Left Iliac Bone BM Bx revealed  "-Normocellular to slightly hypercellular marrow with trilineage hematopoiesis and adequate magakaryocytes - Negative for lymphoma or other form of malignancy".  11/05/2019 PET/CT revealed  "Hypermetabolic lymphadeopathy involving multiple nodal stations in the neck and chest, consistent with reported history of lymphoma."   Most recent lab results (10/20/2019) of CBC w/diff & CMP is as follows: all values are WNL except for WBC at 14.8K, PLT at 588K, MPV at 6.8, Neutro Abs at 12.08K, Glucose at 68, Albumin at 3.0, ALP at 147. 10/20/2019 LDH at 279 09/15/2019 Sed Rate at 73  On review of systems, pt reports fevers, chills, night sweats, rash, new lumps/bumps and denies unexpected weight loss, constipation, abdominal pain and any other symptoms.   On PMHx the pt reports Asthma, Tonsillectomy, Wisdom Tooth Extraction. On Social Hx the pt reports that she is a non-smoker and does not drink much alcohol outside of social situations.  On Family Hx the pt reports skin cancer.   INTERVAL HISTORY:   Sonia Doyle is a wonderful 25 y.o. female who is here for evaluation and management Nodular Sclerosing CHL. The patient's last visit with [place] was on 04/28/2020. The pt reports that she is doing well overall. We  are joined today by her mother and father.  The pt reports that she is back to her baseline feeling and notes no symptoms or concerns. She is currently deciding between [facility] and [facility] [employer] for medical school. The pt notes she has decided not to pursue medical school in [place] at [place].  Of note since the  patient's last visit, pt has had PET Skull Base to Thigh on 06/08/2020, which revealed "1. No findings of active/recurrent malignancy. 2. Interval resolution of the prior benign hypermetabolic brown fat, and interval resolution of the prior diffuse skeletal activity.  Lab results today 06/09/2020 of CBC w/diff and CMP is as follows: all values are WNL.  On review of systems, pt reports intermittent skin reactions and denies tingling/numbness in hands/feet, weight loss, decreased appetite, acute skin rashes, and any other symptoms.  MEDICAL HISTORY:  Childhood Asthma - not requiring preventative asthma medications/inhalers at present time   SURGICAL HISTORY: Tonsillectomy (2003) Wisdom Tooth Extraction   SOCIAL HISTORY: Social History   Socioeconomic History  . Marital status: Single    Spouse name: Not on file  . Number of children: Not on file  . Years of education: Not on file  . Highest education level: Not on file  Occupational History  . Not on file  Tobacco Use  . Smoking status: Never Smoker  . Smokeless tobacco: Never Used  Substance and Sexual Activity  . Alcohol use: Not on file  . Drug use: Not on file  . Sexual activity: Not on file  Other Topics Concern  . Not on file  Social History Narrative  . Not on file   Social Determinants of Health   Financial Resource Strain: Not on file  Food Insecurity: Not on file  Transportation Needs: Not on file  Physical Activity: Not on file  Stress: Not on file  Social Connections: Not on file  Intimate Partner Violence: Not on file    FAMILY HISTORY: Hypothyroidism - Mother  Atrial fibrillation - Maternal Grandmother Diabetes type II - Paternal Grandmother  Dementia - Paternal Grandmother   ALLERGIES:  has No Known Allergies.  MEDICATIONS:  Current Outpatient Medications  Medication Sig Dispense Refill  . albuterol (VENTOLIN HFA) 108 (90 Base) MCG/ACT inhaler Inhale 1 puff into the lungs as needed for wheezing  or shortness of breath. 1 each 0  . dexamethasone (DECADRON) 4 MG tablet Take 2 tablets by mouth once a day for 3 days after chemotherapy. Take with food. 20 tablet 0  . ferrous sulfate 325 (65 FE) MG tablet Take 325 mg by mouth daily with breakfast.    . filgrastim-sndz (ZARXIO) 480 MCG/0.8ML SOSY injection INJECT 480MCG  SUBCUTANEOUSLY DAILY FOR 3  DAYS STARTING 24 HOURS  AFTER CHEMOTHERAPY  TREATMENT EVERY 14 DAYS 2.4 mL 1  . fluticasone (FLOVENT HFA) 110 MCG/ACT inhaler Inhale 1 puff into the lungs as needed. 1 each 0  . fluticasone (FLOVENT HFA) 44 MCG/ACT inhaler Inhale into the lungs.    Marland Kitchen levofloxacin (LEVAQUIN) 500 MG tablet Take 1 tablet (500 mg total) by mouth daily. (Patient not taking: Reported on 03/31/2020) 5 tablet 0  . lidocaine-prilocaine (EMLA) cream Apply to affected area once 30 g 3  . loratadine (CLARITIN) 10 MG tablet Take by mouth.    Marland Kitchen LORazepam (ATIVAN) 0.5 MG tablet Take 1 tablet (0.5 mg total) by mouth every 6 (six) hours as needed (Nausea or vomiting). 30 tablet 0  . Norgestimate-Ethinyl Estradiol Triphasic 0.18/0.215/0.25 MG-25 MCG tab Take 1 tablet  by mouth daily.    . ondansetron (ZOFRAN) 8 MG tablet Take 1 tablet (8 mg total) by mouth 2 (two) times daily as needed. Start on the third day after chemotherapy. 30 tablet 1  . prochlorperazine (COMPAZINE) 10 MG tablet Take 1 tablet (10 mg total) by mouth every 6 (six) hours as needed (Nausea or vomiting). 30 tablet 1  . traMADol (ULTRAM) 50 MG tablet Take 50-100 mg by mouth every 6 (six) hours as needed.     No current facility-administered medications for this visit.    REVIEW OF SYSTEMS:   10 Point review of Systems was done is negative except as noted above.  PHYSICAL EXAMINATION: ECOG PERFORMANCE STATUS: 1 - Symptomatic but completely ambulatory  .BP 123/73 (BP Location: Left Arm, Patient Position: Sitting)   Pulse 87   Temp 98.7 F (37.1 C) (Tympanic)   Resp 18   Ht 5\' 10"  (1.778 m)   Wt 148 lb 1.6 oz  (67.2 kg)   LMP 05/18/2020 (Within Weeks)   SpO2 100%   BMI 21.25 kg/m    GENERAL:alert, in no acute distress and comfortable SKIN: no acute rashes, no significant lesions EYES: conjunctiva are pink and non-injected, sclera anicteric OROPHARYNX: MMM, no exudates, no oropharyngeal erythema or ulceration NECK: supple, no JVD LYMPH:  no palpable lymphadenopathy in the cervical, axillary or inguinal regions LUNGS: clear to auscultation b/l with normal respiratory effort HEART: regular rate & rhythm ABDOMEN:  normoactive bowel sounds , non tender, not distended. Extremity: no pedal edema PSYCH: alert & oriented x 3 with fluent speech NEURO: no focal motor/sensory deficits  LABORATORY DATA:  I have reviewed the data as listed  CBC Latest Ref Rng & Units 06/09/2020 05/11/2020 04/28/2020  WBC 4.0 - 10.5 K/uL 8.5 2.9(L) 2.6(L)  Hemoglobin 12.0 - 15.0 g/dL 12.8 11.8(L) 12.0  Hematocrit 36.0 - 46.0 % 36.1 34.4(L) 35.2(L)  Platelets 150 - 400 K/uL 309 296 261    CMP Latest Ref Rng & Units 06/09/2020 05/11/2020 04/28/2020  Glucose 70 - 99 mg/dL 99 98 112(H)  BUN 6 - 20 mg/dL 9 9 11   Creatinine 0.44 - 1.00 mg/dL 0.72 0.73 0.77  Sodium 135 - 145 mmol/L 141 141 141  Potassium 3.5 - 5.1 mmol/L 4.0 3.8 3.6  Chloride 98 - 111 mmol/L 108 109 109  CO2 22 - 32 mmol/L 24 23 24   Calcium 8.9 - 10.3 mg/dL 9.0 8.8(L) 9.1  Total Protein 6.5 - 8.1 g/dL 6.7 6.1(L) 6.1(L)  Total Bilirubin 0.3 - 1.2 mg/dL 0.4 <0.2(L) 0.3  Alkaline Phos 38 - 126 U/L 56 54 50  AST 15 - 41 U/L 19 14(L) 14(L)  ALT 0 - 44 U/L 10 8 8     RADIOGRAPHIC STUDIES: I have personally reviewed the radiological images as listed and agreed with the findings in the report. NM PET Image Restag (PS) Skull Base To Thigh  Result Date: 06/09/2020 CLINICAL DATA:  Subsequent treatment strategy for Hodgkin's lymphoma. EXAM: NUCLEAR MEDICINE PET SKULL BASE TO THIGH TECHNIQUE: 7.8 mCi F-18 FDG was injected intravenously. Full-ring PET imaging was  performed from the skull base to thigh after the radiotracer. CT data was obtained and used for attenuation correction and anatomic localization. Fasting blood glucose: 75 mg/dl COMPARISON:  01/20/2020 FINDINGS: Mediastinal blood pool activity: SUV max 1.6 Liver activity: SUV max 2.2 NECK: No significant abnormal hypermetabolic activity in this region. The previous hypermetabolic brown fat resolved. Incidental CT findings: none CHEST: No significant abnormal hypermetabolic activity in this region.  Incidental CT findings: Right Port-A-Cath tip: Cavoatrial junction. ABDOMEN/PELVIS: No significant abnormal hypermetabolic activity in this region. No splenomegaly. Incidental CT findings: none SKELETON: No significant abnormal hypermetabolic activity in this region. Prior diffuse hypermetabolic skeletal activity has resolved Incidental CT findings: none IMPRESSION: 1. No findings of active/recurrent malignancy. 2. Interval resolution of the prior benign hypermetabolic brown fat, and interval resolution of the prior diffuse skeletal activity. Electronically Signed   By: Van Clines M.D.   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  -11/05/2019 PET/CT revealed  "Hypermetabolic lymphadeopathy involving multiple nodal stations in the neck and chest, consistent with reported history of lymphoma."  -01/20/2020 PET/CT ([id number]) revealed "1. No findings of pathologically enlarged or hypermetabolic adenopathy." 2) Anemia of chronic disease. 3) chemotherapy related leukopenia/neutropenia - resolved . On G-CSF  PLAN: -Discussed pt labwork, 06/09/2020; blood counts all completely normal, blood chemistries all completely normal. -Discussed pt PET Skull Base to Thigh on 06/08/2020;  completely in remission. -Advised pt she is in remission and we will switch to just monitoring and watching at this time. -Advised pt she can eat whatever she wants at this time. -Discussed Evusheld and pt's eligibility. Will send referral. -Recommended pt receive Prevnar and Pneumovax vaccines. Can get six months post-final treatment. -Recommended pt receive annual flu vaccine during next flu season. -Recommended pt receive no live virus vaccines for two years post treatment. -Will watch every 3-4 months with labs for first two years. Will get repeat CT in 1 year unless new symptoms. -Advised pt that risk of recurrence after five years matches the average risk of the general population. -Advised pt she can start back on estrogen-containing BC pill. -Advised pt to hydrate well and drink 48-64 oz water daily, walk 20-30 min daily.  -Recommended pt not get pregnant 1-2 years post-treatment. -Advised pt to get routine dental cleaning at the six month mark post-treatment. -Will get Port removed. -Will see back in 3 months with labs.   FOLLOW UP: IR for Port-A-Cath removal in 1 to 2 weeks Return to clinic with Dr. Irene Limbo with labs in 3 months Ambulatory referral for Evusheld   The total time spent in the appointment was 30 minutes and more than 50% was on counseling and direct patient cares.   All of the patient's questions were answered with apparent satisfaction. The patient knows to call the clinic with any problems, questions or concerns.    Sullivan Lone MD [place] AAHIVMS [facility] Hematology/Oncology Physician [facility]  (Office):       [phone] (Work cell):  [phone] (Fax):           [phone]  06/09/2020 2:28 PM   I, Reinaldo Raddle, am acting as scribe for Dr. Sullivan Lone, MD.  .I have reviewed the above documentation for accuracy and completeness, and I agree with the above. Brunetta Genera MD

## 2020-06-09 ENCOUNTER — Inpatient Hospital Stay: Payer: 59

## 2020-06-09 ENCOUNTER — Inpatient Hospital Stay: Payer: 59 | Attending: Hematology

## 2020-06-09 ENCOUNTER — Inpatient Hospital Stay (HOSPITAL_BASED_OUTPATIENT_CLINIC_OR_DEPARTMENT_OTHER): Payer: 59 | Admitting: Hematology

## 2020-06-09 VITALS — BP 123/73 | HR 87 | Temp 98.7°F | Resp 18 | Ht 70.0 in | Wt 148.1 lb

## 2020-06-09 DIAGNOSIS — D638 Anemia in other chronic diseases classified elsewhere: Secondary | ICD-10-CM | POA: Diagnosis not present

## 2020-06-09 DIAGNOSIS — Z5111 Encounter for antineoplastic chemotherapy: Secondary | ICD-10-CM

## 2020-06-09 DIAGNOSIS — C8118 Nodular sclerosis classical Hodgkin lymphoma, lymph nodes of multiple sites: Secondary | ICD-10-CM

## 2020-06-09 DIAGNOSIS — C8111 Nodular sclerosis classical Hodgkin lymphoma, lymph nodes of head, face, and neck: Secondary | ICD-10-CM | POA: Diagnosis not present

## 2020-06-09 DIAGNOSIS — Z95828 Presence of other vascular implants and grafts: Secondary | ICD-10-CM

## 2020-06-09 LAB — CBC WITH DIFFERENTIAL/PLATELET
Abs Immature Granulocytes: 0.03 10*3/uL (ref 0.00–0.07)
Basophils Absolute: 0.1 10*3/uL (ref 0.0–0.1)
Basophils Relative: 1 %
Eosinophils Absolute: 0.1 10*3/uL (ref 0.0–0.5)
Eosinophils Relative: 1 %
HCT: 36.1 % (ref 36.0–46.0)
Hemoglobin: 12.8 g/dL (ref 12.0–15.0)
Immature Granulocytes: 0 %
Lymphocytes Relative: 25 %
Lymphs Abs: 2.1 10*3/uL (ref 0.7–4.0)
MCH: 31.3 pg (ref 26.0–34.0)
MCHC: 35.5 g/dL (ref 30.0–36.0)
MCV: 88.3 fL (ref 80.0–100.0)
Monocytes Absolute: 0.9 10*3/uL (ref 0.1–1.0)
Monocytes Relative: 11 %
Neutro Abs: 5.4 10*3/uL (ref 1.7–7.7)
Neutrophils Relative %: 62 %
Platelets: 309 10*3/uL (ref 150–400)
RBC: 4.09 MIL/uL (ref 3.87–5.11)
RDW: 13.2 % (ref 11.5–15.5)
WBC: 8.5 10*3/uL (ref 4.0–10.5)
nRBC: 0 % (ref 0.0–0.2)

## 2020-06-09 LAB — CMP (CANCER CENTER ONLY)
ALT: 10 U/L (ref 0–44)
AST: 19 U/L (ref 15–41)
Albumin: 4.2 g/dL (ref 3.5–5.0)
Alkaline Phosphatase: 56 U/L (ref 38–126)
Anion gap: 9 (ref 5–15)
BUN: 9 mg/dL (ref 6–20)
CO2: 24 mmol/L (ref 22–32)
Calcium: 9 mg/dL (ref 8.9–10.3)
Chloride: 108 mmol/L (ref 98–111)
Creatinine: 0.72 mg/dL (ref 0.44–1.00)
GFR, Estimated: >60 mL/min (ref >60)
Glucose, Bld: 99 mg/dL (ref 70–99)
Potassium: 4 mmol/L (ref 3.5–5.1)
Sodium: 141 mmol/L (ref 135–145)
Total Bilirubin: 0.4 mg/dL (ref 0.3–1.2)
Total Protein: 6.7 g/dL (ref 6.5–8.1)

## 2020-06-09 MED ORDER — HEPARIN SOD (PORK) LOCK FLUSH 100 UNIT/ML IV SOLN
500.0000 [IU] | Freq: Once | INTRAVENOUS | Status: AC | PRN
  Administered 2020-06-09: 500 [IU]
  Filled 2020-06-09: qty 5

## 2020-06-09 MED ORDER — SODIUM CHLORIDE 0.9% FLUSH
10.0000 mL | INTRAVENOUS | Status: DC | PRN
Start: 2020-06-09 — End: 2020-06-09
  Administered 2020-06-09: 10 mL
  Filled 2020-06-09: qty 10

## 2020-06-09 NOTE — Patient Instructions (Signed)
Implanted Port Insertion, Care After This sheet gives you information about how to care for yourself after your procedure. Your health care provider may also give you more specific instructions. If you have problems or questions, contact your health care provider. What can I expect after the procedure? After the procedure, it is common to have:  Discomfort at the port insertion site.  Bruising on the skin over the port. This should improve over 3-4 days. Follow these instructions at home: Port care  After your port is placed, you will get a manufacturer's information card. The card has information about your port. Keep this card with you at all times.  Take care of the port as told by your health care provider. Ask your health care provider if you or a family member can get training for taking care of the port at home. A home health care nurse may also take care of the port.  Make sure to remember what type of port you have. Incision care  Follow instructions from your health care provider about how to take care of your port insertion site. Make sure you: ? Wash your hands with soap and water before and after you change your bandage (dressing). If soap and water are not available, use hand sanitizer. ? Change your dressing as told by your health care provider. ? Leave stitches (sutures), skin glue, or adhesive strips in place. These skin closures may need to stay in place for 2 weeks or longer. If adhesive strip edges start to loosen and curl up, you may trim the loose edges. Do not remove adhesive strips completely unless your health care provider tells you to do that.  Check your port insertion site every day for signs of infection. Check for: ? Redness, swelling, or pain. ? Fluid or blood. ? Warmth. ? Pus or a bad smell.      Activity  Return to your normal activities as told by your health care provider. Ask your health care provider what activities are safe for you.  Do not  lift anything that is heavier than 10 lb (4.5 kg), or the limit that you are told, until your health care provider says that it is safe. General instructions  Take over-the-counter and prescription medicines only as told by your health care provider.  Do not take baths, swim, or use a hot tub until your health care provider approves. Ask your health care provider if you may take showers. You may only be allowed to take sponge baths.  Do not drive for 24 hours if you were given a sedative during your procedure.  Wear a medical alert bracelet in case of an emergency. This will tell any health care providers that you have a port.  Keep all follow-up visits as told by your health care provider. This is important. Contact a health care provider if:  You cannot flush your port with saline as directed, or you cannot draw blood from the port.  You have a fever or chills.  You have redness, swelling, or pain around your port insertion site.  You have fluid or blood coming from your port insertion site.  Your port insertion site feels warm to the touch.  You have pus or a bad smell coming from the port insertion site. Get help right away if:  You have chest pain or shortness of breath.  You have bleeding from your port that you cannot control. Summary  Take care of the port as told by your   health care provider. Keep the manufacturer's information card with you at all times.  Change your dressing as told by your health care provider.  Contact a health care provider if you have a fever or chills or if you have redness, swelling, or pain around your port insertion site.  Keep all follow-up visits as told by your health care provider. This information is not intended to replace advice given to you by your health care provider. Make sure you discuss any questions you have with your health care provider. Document Revised: 10/01/2017 Document Reviewed: 10/01/2017 Elsevier Patient Education   2021 Elsevier Inc.  

## 2020-06-13 ENCOUNTER — Telehealth: Payer: Self-pay | Admitting: Hematology

## 2020-06-13 NOTE — Telephone Encounter (Signed)
Scheduled follow-up appointments per 3/24 los. Patient is aware.

## 2020-06-14 ENCOUNTER — Telehealth: Payer: Self-pay | Admitting: Hematology

## 2020-06-14 NOTE — Telephone Encounter (Signed)
Called pt to r/s appt per 3/29 sch msg. No answer. Left vm for pt to call back to r/s.

## 2020-06-14 NOTE — Progress Notes (Signed)
[place] Patient and [facility] Counseling Note  The patient reported official remission and processed her final appointment with the counselor, as it brought up many feelings. The patient's grandmother passed since last session, which was processed and discussed as well. The counselor and patient discussed having conflicting emotions going on at once, as well as the process of deciding on a medical school. The counselor asked what the patient had learned from counseling and what she was taking away. The counselor provided metaphors, reflective listening, a safe space to process, and validation of feeling. The patient was oriented times three. Her mood was largely contemplative and overall varied. Her affect was appropriate to context and she showed no SI/HI/NSSI.  The patient has finished her active cancer treatment, which is bringing up a lot of thoughts and curiosities. The variance in thought is contributing to a variance in emotions. The concept of 'survivorship'isa little daunting, as the patient has not figured out what it means to her. Patient is beginning to transition out of a primarily 'cancer patient' identity and moving back to student, getting ready for more schooling. Next session will discuss moving forward and process the ending of the counseling relationship.   Gaylyn Rong Counseling Intern

## 2020-06-15 ENCOUNTER — Telehealth: Payer: Self-pay | Admitting: Hematology

## 2020-06-15 NOTE — Telephone Encounter (Signed)
R/s appt per 3/29 sch msg. Pt aware. Okay per MD

## 2020-06-20 ENCOUNTER — Inpatient Hospital Stay: Payer: 59 | Attending: Hematology

## 2020-06-20 ENCOUNTER — Other Ambulatory Visit: Payer: Self-pay | Admitting: Adult Health

## 2020-06-20 ENCOUNTER — Other Ambulatory Visit: Payer: Self-pay

## 2020-06-20 DIAGNOSIS — D638 Anemia in other chronic diseases classified elsewhere: Secondary | ICD-10-CM | POA: Diagnosis not present

## 2020-06-20 DIAGNOSIS — C8111 Nodular sclerosis classical Hodgkin lymphoma, lymph nodes of head, face, and neck: Secondary | ICD-10-CM | POA: Diagnosis present

## 2020-06-20 DIAGNOSIS — C8118 Nodular sclerosis classical Hodgkin lymphoma, lymph nodes of multiple sites: Secondary | ICD-10-CM

## 2020-06-20 DIAGNOSIS — Z298 Encounter for other specified prophylactic measures: Secondary | ICD-10-CM | POA: Diagnosis not present

## 2020-06-20 MED ORDER — CILGAVIMAB (PART OF EVUSHELD) INJECTION
300.0000 mg | Freq: Once | INTRAMUSCULAR | Status: AC
  Administered 2020-06-20: 300 mg via INTRAMUSCULAR
  Filled 2020-06-20: qty 3

## 2020-06-20 MED ORDER — TIXAGEVIMAB (PART OF EVUSHELD) INJECTION
300.0000 mg | Freq: Once | INTRAMUSCULAR | Status: AC
  Administered 2020-06-20: 300 mg via INTRAMUSCULAR
  Filled 2020-06-20: qty 3

## 2020-06-20 NOTE — Progress Notes (Signed)
I connected by phone with Dyke Brackett on 06/20/2020, 10:46 AM to discuss the potential use of a new treatment, tixagevimab/cilgavimab, for pre-exposure prophylaxis for prevention of coronavirus disease 2019 (COVID-19) caused by the SARS-CoV-2 virus.  This patient is a 25 y.o. female that meets the FDA criteria for Emergency Use Authorization of tixagevimab/cilgavimab for pre-exposure prophylaxis of COVID-19 disease. Pt meets following criteria:  Age >12 yr and weight > 40kg  Not currently infected with SARS-CoV-2 and has no known recent exposure to an individual infected with SARS-CoV-2 AND o Who has moderate to severe immune compromise due to a medical condition or receipt of immunosuppressive medications or treatments and may not mount an adequate immune response to COVID-19 vaccination or  o Vaccination with any available COVID-19 vaccine, according to the approved or authorized schedule, is not recommended due to a history of severe adverse reaction (e.g., severe allergic reaction) to a COVID-19 vaccine(s) and/or COVID-19 vaccine component(s).  o Patient meets the following definition of mod-severe immune compromised status: 6. Other actively treated hematologic malignancies or severe congenital immunodeficiency syndromes  I have spoken and communicated the following to the patient or parent/caregiver regarding COVID monoclonal antibody treatment:  1. FDA has authorized the emergency use of tixagevimab/cilgavimab for the pre-exposure prophylaxis of COVID-19 in patients with moderate-severe immunocompromised status, who meet above EUA criteria.  2. The significant known and potential risks and benefits of COVID monoclonal antibody, and the extent to which such potential risks and benefits are unknown.  3. Information on available alternative treatments and the risks and benefits of those alternatives, including clinical trials.  4. The patient or parent/caregiver has the option to accept or  refuse COVID monoclonal antibody treatment.  After reviewing this information with the patient, agree to receive tixagevimab/cilgavimab.  Patient is scheduled this afternoon at 330pm.    Scot Dock, NP, 06/20/2020, 10:46 AM

## 2020-06-20 NOTE — Progress Notes (Signed)
Pt remained stable for 73minute observation period following Euvasheld injections. Visit complete

## 2020-06-21 ENCOUNTER — Encounter: Payer: Self-pay | Admitting: Hematology

## 2020-06-22 NOTE — Telephone Encounter (Signed)
Contacted [facility] [phone] to inquire about scheduling port removal. Per Vickie in [facility] IR, patient can be scheduled for 10 am Friday 07/01/20 (arrive at 9am). Patient contacted, in agreement with time of appt offered. Vickie asked that patient be informed that she would be contacted by nurse from IR phone# in a few days. Patient informed and verbalized understanding

## 2020-06-24 ENCOUNTER — Other Ambulatory Visit: Payer: Self-pay | Admitting: Radiology

## 2020-06-29 ENCOUNTER — Other Ambulatory Visit: Payer: Self-pay | Admitting: Radiology

## 2020-06-29 NOTE — Progress Notes (Signed)
[place] Patient and [facility] Counseling Note  The patient attended Zoom session in her natural hair for the first time, so counselor and patient processed that experience. Counselor and patient discussed medical schools and how the patient was feeling toward each school and making her decision.  The counselor utilized reframing with the patient, which helped her gain perspective. The counselor and patient also discussed the emotions that arise as counseling comes to a close, as well as the patient's struggle writing her last [employer] post. Emotions were processed and the counselor shared metaphors from ACT. The counselor provided active listening, reflections, and therapeutic feedback to the patient. The patient was oriented times three and in a mostly content mood. Her affect was congruent and she showed no signs of SI/HI/NSSI. The patient has finished her active cancer treatment and is deciding on where to attend medical school. Patient is beginning to transition out of a primarily 'cancer patient' identity and moving back to student, getting ready for more schooling. As such, she is experiencing fewer symptoms of anxiety and depression and has increased emotional functioning. Her thoughts focus more on making the right decision, which results in some worry and guilt. However, the patient is feeling more comfortable and confident in reacting to these feelings. Next session will process the end of the counseling relationship.   Gaylyn Rong Counseling Intern

## 2020-06-30 NOTE — H&P (Signed)
Chief Complaint: Patient was seen in consultation today for port-a-catheter removal.   Referring Physician(s): Brunetta Genera  Supervising Physician: Daryll Brod  Patient Status: [facility] - Out-pt  History of Present Illness: Sonia Doyle is a 25 y.o. female with a medical history significant for asthma and Hodgkin's Lymphoma, diagnosed August 2021. She is familiar to IR from a port-a-catheter placement on 11/19/19. She has now completed her chemotherapy with recent PET imaging 06/08/20 showing no findings of active/recurrent malignancy.  Interventional Radiology has been asked to evaluate this patient for port-a-catheter removal.   Past Medical History:  Diagnosis Date  . Asthma   . Hodgkin's disease [facility])     Past Surgical History:  Procedure Laterality Date  . IR IMAGING GUIDED PORT INSERTION  11/19/2019    Allergies: Patient has no known allergies.  Medications: Prior to Admission medications   Medication Sig Start Date End Date Taking? Authorizing Provider  albuterol (VENTOLIN HFA) 108 (90 Base) MCG/ACT inhaler Inhale 1 puff into the lungs as needed for wheezing or shortness of breath. 11/19/19   Louk, Alexandra M, PA-C  dexamethasone (DECADRON) 4 MG tablet Take 2 tablets by mouth once a day for 3 days after chemotherapy. Take with food. 04/28/20   Brunetta Genera, MD  ferrous sulfate 325 (65 FE) MG tablet Take 325 mg by mouth daily with breakfast.    [provider]  filgrastim-sndz (ZARXIO) 480 MCG/0.8ML SOSY injection INJECT 480MCG  SUBCUTANEOUSLY DAILY FOR 3  DAYS STARTING 24 HOURS  AFTER CHEMOTHERAPY  TREATMENT EVERY 14 DAYS 05/20/20   Brunetta Genera, MD  fluticasone (FLOVENT HFA) 110 MCG/ACT inhaler Inhale 1 puff into the lungs as needed. 11/19/19   Louk, [place], PA-C  fluticasone (FLOVENT HFA) 44 MCG/ACT inhaler Inhale into the lungs.    [provider]  levofloxacin (LEVAQUIN) 500 MG tablet Take 1 tablet (500 mg total) by mouth  daily. Patient not taking: Reported on 03/31/2020 03/22/20   Brunetta Genera, MD  lidocaine-prilocaine (EMLA) cream Apply to affected area once 11/24/19   Brunetta Genera, MD  loratadine (CLARITIN) 10 MG tablet Take by mouth.    [provider]  LORazepam (ATIVAN) 0.5 MG tablet Take 1 tablet (0.5 mg total) by mouth every 6 (six) hours as needed (Nausea or vomiting). 11/24/19   Brunetta Genera, MD  Norgestimate-Ethinyl Estradiol Triphasic 0.18/0.215/0.25 MG-25 MCG tab Take 1 tablet by mouth daily.    [provider]  ondansetron (ZOFRAN) 8 MG tablet Take 1 tablet (8 mg total) by mouth 2 (two) times daily as needed. Start on the third day after chemotherapy. 11/24/19   Brunetta Genera, MD  prochlorperazine (COMPAZINE) 10 MG tablet Take 1 tablet (10 mg total) by mouth every 6 (six) hours as needed (Nausea or vomiting). 11/24/19   Brunetta Genera, MD  traMADol (ULTRAM) 50 MG tablet Take 50-100 mg by mouth every 6 (six) hours as needed. 10/28/19   [provider]     No family history on file.  Social History   Socioeconomic History  . Marital status: Single    Spouse name: Not on file  . Number of children: Not on file  . Years of education: Not on file  . Highest education level: Not on file  Occupational History  . Not on file  Tobacco Use  . Smoking status: Never Smoker  . Smokeless tobacco: Never Used  Substance and Sexual Activity  . Alcohol use: Yes  Comment: occasionally  . Drug use: Never  . Sexual activity: Not on file  Other Topics Concern  . Not on file  Social History Narrative  . Not on file   Social Determinants of Health   Financial Resource Strain: Not on file  Food Insecurity: Not on file  Transportation Needs: Not on file  Physical Activity: Not on file  Stress: Not on file  Social Connections: Not on file    Review of Systems: A 12 point ROS discussed and pertinent positives are indicated in the HPI above.  All  other systems are negative.  Review of Systems  Constitutional: Negative for fatigue and fever.  Respiratory: Negative for cough and shortness of breath.   Cardiovascular: Negative for chest pain.  Gastrointestinal: Negative for abdominal pain, diarrhea, nausea and vomiting.    Vital Signs: BP 121/64   Pulse 66   Temp 98.2 F (36.8 C) (Oral)   Ht 5\' 10"  (1.778 m)   Wt 145 lb (65.8 kg)   SpO2 100%   BMI 20.81 kg/m   Physical Exam Constitutional:      General: She is not in acute distress.    Appearance: She is not ill-appearing.  Pulmonary:     Effort: Pulmonary effort is normal.  Neurological:     Mental Status: She is alert and oriented to person, place, and time.     Imaging: NM PET Image Restag (PS) Skull Base To Thigh  Result Date: 06/09/2020 CLINICAL DATA:  Subsequent treatment strategy for Hodgkin's lymphoma. EXAM: NUCLEAR MEDICINE PET SKULL BASE TO THIGH TECHNIQUE: 7.8 mCi F-18 FDG was injected intravenously. Full-ring PET imaging was performed from the skull base to thigh after the radiotracer. CT data was obtained and used for attenuation correction and anatomic localization. Fasting blood glucose: 75 mg/dl COMPARISON:  01/20/2020 FINDINGS: Mediastinal blood pool activity: SUV max 1.6 Liver activity: SUV max 2.2 NECK: No significant abnormal hypermetabolic activity in this region. The previous hypermetabolic brown fat resolved. Incidental CT findings: none CHEST: No significant abnormal hypermetabolic activity in this region. Incidental CT findings: Right Port-A-Cath tip: Cavoatrial junction. ABDOMEN/PELVIS: No significant abnormal hypermetabolic activity in this region. No splenomegaly. Incidental CT findings: none SKELETON: No significant abnormal hypermetabolic activity in this region. Prior diffuse hypermetabolic skeletal activity has resolved Incidental CT findings: none IMPRESSION: 1. No findings of active/recurrent malignancy. 2. Interval resolution of the prior  benign hypermetabolic brown fat, and interval resolution of the prior diffuse skeletal activity. Electronically Signed   By: Van Clines M.D.   On: 06/09/2020 13:18    Labs:  CBC: Recent Labs    04/14/20 1140 04/28/20 0900 05/11/20 1136 06/09/20 1340  WBC 4.4 2.6* 2.9* 8.5  HGB 12.4 12.0 11.8* 12.8  HCT 35.4* 35.2* 34.4* 36.1  PLT 336 261 296 309    COAGS: No results for input(s): INR, APTT in the last 8760 hours.  BMP: Recent Labs    11/25/19 1435 12/03/19 1116 12/10/19 1147 12/14/19 0820 12/31/19 0945 04/14/20 1140 04/28/20 0900 05/11/20 1136 06/09/20 1340  NA 137 137 139 142   < > 141 141 141 141  K 4.2 4.2 4.2 3.6   < > 4.1 3.6 3.8 4.0  CL 105 104 105 108   < > 110 109 109 108  CO2 27 26 28 25    < > 24 24 23 24   GLUCOSE 92 70 81 123*   < > 97 112* 98 99  BUN 8 11 9 8    < >  9 11 9 9   CALCIUM 8.8* 9.6 9.4 9.3   < > 9.3 9.1 8.8* 9.0  CREATININE 0.71 0.74 0.72 0.87   < > 0.75 0.77 0.73 0.72  GFRNONAA >60 >60 >60 >60   < > >60 >60 >60 >60  GFRAA >60 >60 >60 >60  --   --   --   --   --    < > = values in this interval not displayed.    LIVER FUNCTION TESTS: Recent Labs    04/14/20 1140 04/28/20 0900 05/11/20 1136 06/09/20 1340  BILITOT 0.4 0.3 <0.2* 0.4  AST 17 14* 14* 19  ALT 9 8 8 10   ALKPHOS 59 50 54 56  PROT 6.7 6.1* 6.1* 6.7  ALBUMIN 4.1 3.9 3.9 4.2    TUMOR MARKERS: No results for input(s): AFPTM, CEA, CA199, CHROMGRNA in the last 8760 hours.  Assessment and Plan:  Hodgkin's Lymphoma in remission; chemotherapy complete: Sonia Doyle, 26 year old female, presents today to the [facility] for removal of her port-a-catheter. Patient has requested local anesthesia only.   Risks and benefits of port-a-catheter removal were discussed with the patient including, but not limited to bleeding and infection. All of the patient's questions were answered, patient is agreeable to proceed.   Consent signed and in  chart.  Thank you for this interesting consult.  I greatly enjoyed meeting Sonia Doyle and look forward to participating in their care.  A copy of this report was sent to the requesting provider on this date.  Electronically Signed: Soyla Dryer, AGACNP-BC [phone] 07/01/2020, 9:53 AM   I spent a total of  30 Minutes   in face to face in clinical consultation, greater than 50% of which was counseling/coordinating care for port-a-catheter removal.

## 2020-07-01 ENCOUNTER — Other Ambulatory Visit: Payer: Self-pay

## 2020-07-01 ENCOUNTER — Ambulatory Visit
Admission: RE | Admit: 2020-07-01 | Discharge: 2020-07-01 | Disposition: A | Discharge status: Home / Self Care | Payer: 59 | Source: Ambulatory Visit | Attending: Hematology | Admitting: Hematology

## 2020-07-01 ENCOUNTER — Ambulatory Visit: Payer: 59

## 2020-07-01 DIAGNOSIS — Z7951 Long term (current) use of inhaled steroids: Secondary | ICD-10-CM | POA: Diagnosis not present

## 2020-07-01 DIAGNOSIS — Z79899 Other long term (current) drug therapy: Secondary | ICD-10-CM | POA: Insufficient documentation

## 2020-07-01 DIAGNOSIS — C8118 Nodular sclerosis classical Hodgkin lymphoma, lymph nodes of multiple sites: Secondary | ICD-10-CM

## 2020-07-01 DIAGNOSIS — J45909 Unspecified asthma, uncomplicated: Secondary | ICD-10-CM | POA: Diagnosis not present

## 2020-07-01 DIAGNOSIS — Z452 Encounter for adjustment and management of vascular access device: Secondary | ICD-10-CM | POA: Insufficient documentation

## 2020-07-01 DIAGNOSIS — C8190 Hodgkin lymphoma, unspecified, unspecified site: Secondary | ICD-10-CM | POA: Diagnosis not present

## 2020-07-01 HISTORY — PX: IR REMOVAL TUN ACCESS W/ PORT W/O FL MOD SED: IMG2290

## 2020-07-01 HISTORY — DX: Hodgkin lymphoma, unspecified, unspecified site: C81.90

## 2020-07-01 HISTORY — DX: Unspecified asthma, uncomplicated: J45.909

## 2020-07-01 MED ORDER — SODIUM CHLORIDE 0.9 % IV SOLN: INTRAVENOUS | Status: DC

## 2020-07-01 NOTE — Discharge Instructions (Signed)
Implanted Port Removal, Care After Refer to this sheet in the next few weeks. These instructions provide you with information about caring for yourself after your procedure. Your health care provider may also give you more specific instructions. Your treatment has been planned according to current medical practices, but problems sometimes occur. Call your health care provider if you have any problems or questions after your procedure. What can I expect after the procedure? After the procedure, it is common to have: Soreness or pain near your incision. Some swelling or bruising near your incision.  Follow these instructions at home: Medicines Take over-the-counter and prescription medicines only as told by your health care provider. If you were prescribed an antibiotic medicine, take it as told by your health care provider. Do not stop taking the antibiotic even if you start to feel better. Bathing You may shower tomorrow.  Incision care You have an incision with sutures that are dissolvable and skin glue over top incision.  This skin glue will wear off over time.  Do not peel or try to remove this yourself. Keep your dressing dry. Check your incision area every day for signs of infection and if present call your doctor and let them know. Check for: More redness, swelling, or pain. More fluid or blood. Warmth. Pus or a bad smell. Driving If you received a sedative, do not drive for 24 hours after the procedure. If you did not receive a sedative, ask your health care provider when it is safe to drive. Activity Return to your normal activities as told by your health care provider. Ask your health care provider what activities are safe for you. Until your health care provider says it is safe: Do not lift anything that is heavier than 10 lb (4.5 kg). Do not do activities that involve lifting your arms over your head. General instructions Do not use any tobacco products, such as cigarettes,  chewing tobacco, and e-cigarettes. Tobacco can delay healing. If you need help quitting, ask your health care provider. Keep all follow-up visits as told by your health care provider. This is important. Contact a health care provider if: You have more redness, swelling, or pain around your incision. You have more fluid or blood coming from your incision. Your incision feels warm to the touch. You have pus or a bad smell coming from your incision. You have a fever. You have pain that is not relieved by your pain medicine. Get help right away if: You have chest pain. You have difficulty breathing. This information is not intended to replace advice given to you by your health care provider. Make sure you discuss any questions you have with your health care provider. Document Released: 02/14/2015 Document Revised: 08/11/2015 Document Reviewed: 12/08/2014 Elsevier Interactive Patient Education  2018 [employer]  

## 2020-07-01 NOTE — Procedures (Signed)
Interventional Radiology Procedure Note ° °Procedure: RT IJ PORT REMOVAL   ° °Complications: None ° °Estimated Blood Loss:  MIN ° °Findings: °FULL REPORT IN PACS °   ° °M. TREVOR Meigan Pates, MD ° ° ° °

## 2020-07-14 NOTE — Progress Notes (Signed)
[facility] Patient and [facility] Counseling Note  The patient and counselor discussed updates on patient's medical status, employment/living situation, and her medical school decision. The counselor asked questions to elicit emotions surrounding these events. The counselor and patient identified this session marking the end of an era and anxiety on other topics. The counselor provided psychoeducation surrounding anxiety. The counselor asked the patient what she was taking away from counseling and engaged in meaning making around this experience. The counselor and patient discussed how she would know to seek counseling, how to find a counselor, and final reflections on working with the counselor. The counselor provided active listening, new perspectives, empathy, and reflections. The patient was oriented times three and in a happy mood. Her affect was congruent. There were no indications of SI/HI/NSSI. 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 Patient is beginning to transition out of a primarily 'cancer patient' identity and moving back to student, getting ready for more schooling. As such, she is experiencing fewer symptoms of anxiety and depression and has increased emotional functioning.   Gaylyn Rong Counseling Intern

## 2020-08-09 ENCOUNTER — Encounter: Payer: Self-pay | Admitting: Hematology

## 2020-08-12 ENCOUNTER — Encounter: Payer: Self-pay | Admitting: Hematology

## 2020-08-23 ENCOUNTER — Telehealth: Payer: Self-pay

## 2020-08-23 NOTE — Telephone Encounter (Signed)
Contacted pt to verify that she received her letter in the mail required for her missed days at work. Pt verified that she has.

## 2020-09-12 ENCOUNTER — Encounter: Payer: Self-pay | Admitting: Hematology

## 2020-09-13 ENCOUNTER — Telehealth: Payer: Self-pay | Admitting: Hematology

## 2020-09-13 NOTE — Telephone Encounter (Signed)
R/s appt per 6/28 sch msg. Pt aware.

## 2020-09-16 ENCOUNTER — Ambulatory Visit: Payer: 59 | Admitting: Hematology

## 2020-09-16 ENCOUNTER — Other Ambulatory Visit: Payer: 59

## 2020-10-08 NOTE — Progress Notes (Signed)
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 female who has been referred to [place] by Dr. Fonda Doyle for evaluation and management of newly diagnosed nodular sclerosis CHL. Pt is accompanied today by her mother. The pt reports that she is doing well overall.   The pt reports that she has asthma and has had a wisdom tooth extraction and tonsillectomy. She is only using her inhaler as needed at this time. She has no other known medical issues or medication allergies. 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 She has a family history significant for skin cancers.   Pt noticed an enlarged right-sided supraclavicular lymph node, which lead to the work up. She has since noticed more enlarged lymph nodes, but also a reduction in the size of the lymph node that initially concerned her. She had a fever in June that was accompanied by chills and night sweats. Pt denies weight loss, but has had multiple itchy rashes.   Pt has not had a port placed yet, as she had to cancel her previous appointment. Pt is scheduled for a consultation at [facility] later today. She has been set up for ovum harvesting in about 7-10 days for fertility preservation. She is currently on Menopur.   Prior to her referral pt received one IV Iron test dose, which caused her to be hoarse. They did not attempt any further IV Iron infusions. She was told to continue PO Iron.   08/24/2019 US Soft Tissue revealed "Biliteral supraclavicular nodules with atypical featues for lymph nodes, consider further evaluation with enhanced CT of the chest, abdomen and pelvis for the presence of additional adenopathy".   09/03/2019 CT  Chest revealed "Anterior mediastinal, bilateral hilar and neck adenopathy most suggestive of Lymphoma".   10/09/2019 Right Cervical Lymph Node Biopsy revealed "Classic Hodgkin's lymphoma, nodular sclerosis type".   10/09/2019 Flow Cytometry Report revealed "Negative for B-cell lymphoproliferative disorder or aberrant T-cell immunophenotype"  10/27/2019 ECHO was normal  11/03/2019 Left Iliac Bone BM Bx revealed  "-Normocellular to slightly hypercellular marrow with trilineage hematopoiesis and adequate magakaryocytes - Negative for lymphoma or other form of malignancy".  11/05/2019 PET/CT revealed  "Hypermetabolic lymphadeopathy involving multiple nodal stations in the neck and chest, consistent with reported history of lymphoma."   Most recent lab results (10/20/2019) of CBC w/diff & CMP is as follows: all values are WNL except for WBC at 14.8K, PLT at 588K, MPV at 6.8, Neutro Abs at 12.08K, Glucose at 68, Albumin at 3.0, ALP at 147. 10/20/2019 LDH at 279 09/15/2019 Sed Rate at 73  On review of systems, pt reports fevers, chills, night sweats, rash, new lumps/bumps and denies unexpected weight loss, constipation, abdominal pain and any other symptoms.   On PMHx the pt reports Asthma, Tonsillectomy, Wisdom Tooth Extraction. On Social Hx the pt reports that she is a non-smoker and does not drink much alcohol outside of social situations.  On Family Hx the pt reports skin cancer.   INTERVAL HISTORY:   Sonia Doyle is a wonderful 25 y.o. female who is here for continued follow-up and management of nodular Sclerosing CHL. The patient's last visit with [place] was on 06/09/2020. The pt reports that she  is doing well overall.  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  She did develop COVID end of June and was treated with Paxil with by her primary care physician and had minor symptoms with no significant complications. Had received Evusheld on 06/20/2020  She  notes no other acute new issues today.  No fevers no chills no night sweats no new lumps or bumps.  Lab results today 10/10/2020 of CBC w/diff and CMP is as follows:  CBC within normal limits, CMP within normal limits Sedimentation rate 0.  On review of systems, pt reports no other acute new concerns.  Does report that her periods have restarted.NAD   MEDICAL HISTORY:  Childhood Asthma - not requiring preventative asthma medications/inhalers at present time   SURGICAL HISTORY: Tonsillectomy (2003) Wisdom Tooth Extraction   SOCIAL HISTORY: Social History   Socioeconomic History   Marital status: Single    Spouse name: Not on file   Number of children: Not on file   Years of education: Not on file   Highest education level: Not on file  Occupational History   Not on file  Tobacco Use   Smoking status: Never   Smokeless tobacco: Never  Substance and Sexual Activity   Alcohol use: Yes    Comment: occasionally   Drug use: Never   Sexual activity: Not on file  Other Topics Concern   Not on file  Social History Narrative   Not on file   Social Determinants of Health   Financial Resource Strain: Not on file  Food Insecurity: Not on file  Transportation Needs: Not on file  Physical Activity: Not on file  Stress: Not on file  Social Connections: Not on file  Intimate Partner Violence: Not on file    FAMILY HISTORY: Hypothyroidism - Mother  Atrial fibrillation - Maternal Grandmother Diabetes type II - Paternal Grandmother  Dementia - Paternal Grandmother   ALLERGIES:  has No Known Allergies.  MEDICATIONS:  Current Outpatient Medications  Medication Sig Dispense Refill   albuterol (VENTOLIN HFA) 108 (90 Base) MCG/ACT inhaler Inhale 1 puff into the lungs as needed for wheezing or shortness of breath. 1 each 0   dexamethasone (DECADRON) 4 MG tablet Take 2 tablets by mouth once a day for 3 days after chemotherapy. Take with food. (Patient not taking: Reported on  07/01/2020) 20 tablet 0   ferrous sulfate 325 (65 FE) MG tablet Take 325 mg by mouth daily with breakfast. (Patient not taking: Reported on 07/01/2020)     filgrastim-sndz (ZARXIO) 480 MCG/0.8ML SOSY injection INJECT 480MCG  SUBCUTANEOUSLY DAILY FOR 3  DAYS STARTING 24 HOURS  AFTER CHEMOTHERAPY  TREATMENT EVERY 14 DAYS (Patient not taking: Reported on 07/01/2020) 2.4 mL 1   fluticasone (FLOVENT HFA) 110 MCG/ACT inhaler Inhale 1 puff into the lungs as needed. 1 each 0   fluticasone (FLOVENT HFA) 44 MCG/ACT inhaler Inhale into the lungs. (Patient not taking: Reported on 07/01/2020)     levofloxacin (LEVAQUIN) 500 MG tablet Take 1 tablet (500 mg total) by mouth daily. (Patient not taking: No sig reported) 5 tablet 0   lidocaine-prilocaine (EMLA) cream Apply to affected area once (Patient not taking: Reported on 07/01/2020) 30 g 3   loratadine (CLARITIN) 10 MG tablet Take by mouth.     LORazepam (ATIVAN) 0.5 MG tablet Take 1 tablet (0.5 mg total) by mouth every 6 (six) hours as needed (Nausea or vomiting). (Patient not taking: Reported on 07/01/2020) 30 tablet  0   Norgestimate-Ethinyl Estradiol Triphasic 0.18/0.215/0.25 MG-25 MCG tab Take 1 tablet by mouth daily.     ondansetron (ZOFRAN) 8 MG tablet Take 1 tablet (8 mg total) by mouth 2 (two) times daily as needed. Start on the third day after chemotherapy. (Patient not taking: Reported on 07/01/2020) 30 tablet 1   prochlorperazine (COMPAZINE) 10 MG tablet Take 1 tablet (10 mg total) by mouth every 6 (six) hours as needed (Nausea or vomiting). (Patient not taking: Reported on 07/01/2020) 30 tablet 1   traMADol (ULTRAM) 50 MG tablet Take 50-100 mg by mouth every 6 (six) hours as needed. (Patient not taking: Reported on 07/01/2020)     No current facility-administered medications for this visit.    REVIEW OF SYSTEMS:   10 Point review of Systems was done is negative except as noted above.  PHYSICAL EXAMINATION: ECOG PERFORMANCE STATUS: 1 - Symptomatic but  completely ambulatory  .There were no vitals taken for this visit.   NAD GENERAL:alert, in no acute distress and comfortable SKIN: no acute rashes, no significant lesions EYES: conjunctiva are pink and non-injected, sclera anicteric OROPHARYNX: MMM, no exudates, no oropharyngeal erythema or ulceration NECK: supple, no JVD LYMPH:  no palpable lymphadenopathy in the cervical, axillary or inguinal regions LUNGS: clear to auscultation b/l with normal respiratory effort HEART: regular rate & rhythm ABDOMEN:  normoactive bowel sounds , non tender, not distended. Extremity: no pedal edema PSYCH: alert & oriented x 3 with fluent speech NEURO: no focal motor/sensory deficits  LABORATORY DATA:  I have reviewed the data as listed  CBC Latest Ref Rng & Units 10/10/2020 06/09/2020 05/11/2020  WBC 4.0 - 10.5 K/uL 7.0 8.5 2.9(L)  Hemoglobin 12.0 - 15.0 g/dL 13.9 12.8 11.8(L)  Hematocrit 36.0 - 46.0 % 39.5 36.1 34.4(L)  Platelets 150 - 400 K/uL 296 309 296    CMP Latest Ref Rng & Units 10/10/2020 06/09/2020 05/11/2020  Glucose 70 - 99 mg/dL 96 99 98  BUN 6 - 20 mg/dL '10 9 9  '$ Creatinine 0.44 - 1.00 mg/dL 0.90 0.72 0.73  Sodium 135 - 145 mmol/L 138 141 141  Potassium 3.5 - 5.1 mmol/L 4.2 4.0 3.8  Chloride 98 - 111 mmol/L 105 108 109  CO2 22 - 32 mmol/L '25 24 23  '$ Calcium 8.9 - 10.3 mg/dL 9.4 9.0 8.8(L)  Total Protein 6.5 - 8.1 g/dL 6.8 6.7 6.1(L)  Total Bilirubin 0.3 - 1.2 mg/dL 0.7 0.4 <0.2(L)  Alkaline Phos 38 - 126 U/L 55 56 54  AST 15 - 41 U/L 12(L) 19 14(L)  ALT 0 - 44 U/L '7 10 8    '$ RADIOGRAPHIC STUDIES: I have personally reviewed the radiological images as listed and agreed with the findings in the report. No results found.   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  -11/05/2019  PET/CT revealed  "Hypermetabolic lymphadeopathy involving multiple nodal stations in the neck and chest, consistent with reported history of lymphoma."  -01/20/2020 PET/CT ([id number]) revealed "1. No findings of pathologically enlarged or hypermetabolic adenopathy." 2) Anemia of chronic disease. 3) chemotherapy related leukopenia/neutropenia - resolved . On G-CSF  PLAN: -Discussed pt labwork today, 10/10/2020; CBC, cmp, sed rate WNL -Patient has no lab or clinical concerns suggestive of Hodgkin's lymphoma recurrence at this time. -She is in good spirits and has recovered without any significant  chemotherapy toxicities.  She is excited about starting medical school at [employer] pt to hydrate well and drink 48-64 oz water daily, walk 20-30 min daily.  -Recommended pt not get pregnant 2 years post-treatment. -Will shall see her back in 3 months   FOLLOW UP: Return to clinic with Dr. Irene Limbo with labs in 3 months   The total time spent in the appointment was 20 minutes and more than 50% was on counseling and direct patient cares.   All of the patient's questions were answered with apparent satisfaction. The patient knows to call the clinic with any problems, questions or concerns.    Sullivan Lone MD [place] AAHIVMS [facility] Hematology/Oncology Physician [facility]  (Office):       [phone] (Work cell):  [phone] (Fax):           [phone]  10/08/2020 11:33 AM   I, Reinaldo Raddle, am acting as scribe for Dr. Sullivan Lone, MD.   .I have reviewed the above documentation for accuracy and completeness, and I agree with the above. Brunetta Genera MD

## 2020-10-10 ENCOUNTER — Other Ambulatory Visit: Payer: Self-pay

## 2020-10-10 ENCOUNTER — Inpatient Hospital Stay (HOSPITAL_BASED_OUTPATIENT_CLINIC_OR_DEPARTMENT_OTHER): Payer: 59 | Admitting: Hematology

## 2020-10-10 ENCOUNTER — Inpatient Hospital Stay: Payer: 59 | Attending: Hematology

## 2020-10-10 VITALS — BP 124/78 | HR 94 | Temp 98.6°F | Resp 18 | Wt 148.4 lb

## 2020-10-10 DIAGNOSIS — J45909 Unspecified asthma, uncomplicated: Secondary | ICD-10-CM | POA: Insufficient documentation

## 2020-10-10 DIAGNOSIS — D630 Anemia in neoplastic disease: Secondary | ICD-10-CM | POA: Insufficient documentation

## 2020-10-10 DIAGNOSIS — C8118 Nodular sclerosis classical Hodgkin lymphoma, lymph nodes of multiple sites: Secondary | ICD-10-CM

## 2020-10-10 DIAGNOSIS — Z8616 Personal history of COVID-19: Secondary | ICD-10-CM | POA: Diagnosis not present

## 2020-10-10 DIAGNOSIS — Z808 Family history of malignant neoplasm of other organs or systems: Secondary | ICD-10-CM | POA: Diagnosis not present

## 2020-10-10 DIAGNOSIS — C8111 Nodular sclerosis classical Hodgkin lymphoma, lymph nodes of head, face, and neck: Secondary | ICD-10-CM | POA: Insufficient documentation

## 2020-10-10 LAB — CMP (CANCER CENTER ONLY)
ALT: 7 U/L (ref 0–44)
AST: 12 U/L — ABNORMAL LOW (ref 15–41)
Albumin: 3.8 g/dL (ref 3.5–5.0)
Alkaline Phosphatase: 55 U/L (ref 38–126)
Anion gap: 8 (ref 5–15)
BUN: 10 mg/dL (ref 6–20)
CO2: 25 mmol/L (ref 22–32)
Calcium: 9.4 mg/dL (ref 8.9–10.3)
Chloride: 105 mmol/L (ref 98–111)
Creatinine: 0.9 mg/dL (ref 0.44–1.00)
GFR, Estimated: >60 mL/min (ref >60)
Glucose, Bld: 96 mg/dL (ref 70–99)
Potassium: 4.2 mmol/L (ref 3.5–5.1)
Sodium: 138 mmol/L (ref 135–145)
Total Bilirubin: 0.7 mg/dL (ref 0.3–1.2)
Total Protein: 6.8 g/dL (ref 6.5–8.1)

## 2020-10-10 LAB — SEDIMENTATION RATE: Sed Rate: 0 mm/hr (ref 0–22)

## 2020-10-10 LAB — CBC WITH DIFFERENTIAL/PLATELET
Abs Immature Granulocytes: 0.01 10*3/uL (ref 0.00–0.07)
Basophils Absolute: 0 10*3/uL (ref 0.0–0.1)
Basophils Relative: 1 %
Eosinophils Absolute: 0.1 10*3/uL (ref 0.0–0.5)
Eosinophils Relative: 1 %
HCT: 39.5 % (ref 36.0–46.0)
Hemoglobin: 13.9 g/dL (ref 12.0–15.0)
Immature Granulocytes: 0 %
Lymphocytes Relative: 23 %
Lymphs Abs: 1.6 10*3/uL (ref 0.7–4.0)
MCH: 31.8 pg (ref 26.0–34.0)
MCHC: 35.2 g/dL (ref 30.0–36.0)
MCV: 90.4 fL (ref 80.0–100.0)
Monocytes Absolute: 0.4 10*3/uL (ref 0.1–1.0)
Monocytes Relative: 6 %
Neutro Abs: 4.8 10*3/uL (ref 1.7–7.7)
Neutrophils Relative %: 69 %
Platelets: 296 10*3/uL (ref 150–400)
RBC: 4.37 MIL/uL (ref 3.87–5.11)
RDW: 12.2 % (ref 11.5–15.5)
WBC: 7 10*3/uL (ref 4.0–10.5)
nRBC: 0 % (ref 0.0–0.2)

## 2020-10-16 ENCOUNTER — Encounter: Payer: Self-pay | Admitting: Hematology

## 2021-01-02 ENCOUNTER — Encounter: Payer: Self-pay | Admitting: Hematology

## 2021-01-09 ENCOUNTER — Encounter: Payer: Self-pay | Admitting: Hematology

## 2021-01-09 ENCOUNTER — Inpatient Hospital Stay: Payer: BC Managed Care – PPO | Attending: Hematology

## 2021-01-09 ENCOUNTER — Inpatient Hospital Stay: Payer: BC Managed Care – PPO | Admitting: Hematology

## 2021-01-09 ENCOUNTER — Other Ambulatory Visit: Payer: Self-pay

## 2021-01-09 VITALS — BP 127/84 | HR 71 | Temp 97.8°F | Resp 17 | Ht 70.0 in | Wt 146.8 lb

## 2021-01-09 DIAGNOSIS — C8118 Nodular sclerosis classical Hodgkin lymphoma, lymph nodes of multiple sites: Secondary | ICD-10-CM

## 2021-01-09 DIAGNOSIS — Z5111 Encounter for antineoplastic chemotherapy: Secondary | ICD-10-CM

## 2021-01-09 LAB — CBC WITH DIFFERENTIAL/PLATELET
Abs Immature Granulocytes: 0.01 10*3/uL (ref 0.00–0.07)
Basophils Absolute: 0.1 10*3/uL (ref 0.0–0.1)
Basophils Relative: 1 %
Eosinophils Absolute: 0.1 10*3/uL (ref 0.0–0.5)
Eosinophils Relative: 2 %
HCT: 40.5 % (ref 36.0–46.0)
Hemoglobin: 13.8 g/dL (ref 12.0–15.0)
Immature Granulocytes: 0 %
Lymphocytes Relative: 41 %
Lymphs Abs: 2.1 10*3/uL (ref 0.7–4.0)
MCH: 31.2 pg (ref 26.0–34.0)
MCHC: 34.1 g/dL (ref 30.0–36.0)
MCV: 91.4 fL (ref 80.0–100.0)
Monocytes Absolute: 0.4 10*3/uL (ref 0.1–1.0)
Monocytes Relative: 7 %
Neutro Abs: 2.6 10*3/uL (ref 1.7–7.7)
Neutrophils Relative %: 49 %
Platelets: 286 10*3/uL (ref 150–400)
RBC: 4.43 MIL/uL (ref 3.87–5.11)
RDW: 11.2 % — ABNORMAL LOW (ref 11.5–15.5)
WBC: 5.2 10*3/uL (ref 4.0–10.5)
nRBC: 0 % (ref 0.0–0.2)

## 2021-01-09 LAB — CMP (CANCER CENTER ONLY)
ALT: 11 U/L (ref 0–44)
AST: 17 U/L (ref 15–41)
Albumin: 4.2 g/dL (ref 3.5–5.0)
Alkaline Phosphatase: 49 U/L (ref 38–126)
Anion gap: 7 (ref 5–15)
BUN: 11 mg/dL (ref 6–20)
CO2: 26 mmol/L (ref 22–32)
Calcium: 9.3 mg/dL (ref 8.9–10.3)
Chloride: 106 mmol/L (ref 98–111)
Creatinine: 0.86 mg/dL (ref 0.44–1.00)
GFR, Estimated: >60 mL/min (ref >60)
Glucose, Bld: 86 mg/dL (ref 70–99)
Potassium: 3.7 mmol/L (ref 3.5–5.1)
Sodium: 139 mmol/L (ref 135–145)
Total Bilirubin: 0.5 mg/dL (ref 0.3–1.2)
Total Protein: 6.8 g/dL (ref 6.5–8.1)

## 2021-01-09 NOTE — Progress Notes (Signed)
HEMATOLOGY/ONCOLOGY CLINIC NOTE  Date of Service: 01/09/2021  Patient Care Team: Pcp, No as PCP - General  CHIEF COMPLAINTS/PURPOSE OF CONSULTATION:  Nodular sclerosis classical Hodgkin lymphoma  HISTORY OF PRESENTING ILLNESS:   Sonia Doyle is a wonderful 25 y.o. female who has been referred to [place] by Dr. Fonda Kinder for evaluation and management of newly diagnosed nodular sclerosis CHL. Pt is accompanied today by her mother. The pt reports that she is doing well overall.   The pt reports that she has asthma and has had a wisdom tooth extraction and tonsillectomy. She is only using her inhaler as needed at this time. She has no other known medical issues or medication allergies. 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 She has a family history significant for skin cancers.   Pt noticed an enlarged right-sided supraclavicular lymph node, which lead to the work up. She has since noticed more enlarged lymph nodes, but also a reduction in the size of the lymph node that initially concerned her. She had a fever in June that was accompanied by chills and night sweats. Pt denies weight loss, but has had multiple itchy rashes.   Pt has not had a port placed yet, as she had to cancel her previous appointment. Pt is scheduled for a consultation at [facility] later today. She has been set up for ovum harvesting in about 7-10 days for fertility preservation. She is currently on Menopur.   Prior to her referral pt received one IV Iron test dose, which caused her to be hoarse. They did not attempt any further IV Iron infusions. She was told to continue PO Iron.   08/24/2019 US Soft Tissue revealed "Biliteral supraclavicular nodules with atypical featues for lymph nodes, consider further evaluation with enhanced CT of the chest, abdomen and pelvis for the presence of additional adenopathy".   09/03/2019 CT Chest revealed "Anterior  mediastinal, bilateral hilar and neck adenopathy most suggestive of Lymphoma".   10/09/2019 Right Cervical Lymph Node Biopsy revealed "Classic Hodgkin's lymphoma, nodular sclerosis type".   10/09/2019 Flow Cytometry Report revealed "Negative for B-cell lymphoproliferative disorder or aberrant T-cell immunophenotype"  10/27/2019 ECHO was normal  11/03/2019 Left Iliac Bone BM Bx revealed  "-Normocellular to slightly hypercellular marrow with trilineage hematopoiesis and adequate magakaryocytes - Negative for lymphoma or other form of malignancy".  11/05/2019 PET/CT revealed  "Hypermetabolic lymphadeopathy involving multiple nodal stations in the neck and chest, consistent with reported history of lymphoma."   Most recent lab results (10/20/2019) of CBC w/diff & CMP is as follows: all values are WNL except for WBC at 14.8K, PLT at 588K, MPV at 6.8, Neutro Abs at 12.08K, Glucose at 68, Albumin at 3.0, ALP at 147. 10/20/2019 LDH at 279 09/15/2019 Sed Rate at 73  On review of systems, pt reports fevers, chills, night sweats, rash, new lumps/bumps and denies unexpected weight loss, constipation, abdominal pain and any other symptoms.   On PMHx the pt reports Asthma, Tonsillectomy, Wisdom Tooth Extraction. On Social Hx the pt reports that she is a non-smoker and does not drink much alcohol outside of social situations.  On Family Hx the pt reports skin cancer.    INTERVAL HISTORY:   Sonia Doyle is a wonderful 25 y.o. female who is here for evaluation and management Nodular Sclerosing CHL. The patient's last visit with [place] was on 10/10/2020.   Patient notes she has been doing well  has had no acute new symptoms since her last clinic visit. 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  Some academic stress as expected but no other concerns.  Patient completed her last cycle of treatment on 05/11/2020.  Reports no significant residual  toxicities.  No neuropathy.  Eating well.  Weight has been stable. No fevers no chills no night sweats no new lumps or bumps. She notes minimal stable subcentimeter nodule in the left supraclavicular area which has not changed over the last 6 months or more. No other acute new symptoms.  MEDICAL HISTORY:  Childhood Asthma - not requiring preventative asthma medications/inhalers at present time   SURGICAL HISTORY: Tonsillectomy (2003) Wisdom Tooth Extraction   SOCIAL HISTORY: Social History   Socioeconomic History   Marital status: Single    Spouse name: Not on file   Number of children: Not on file   Years of education: Not on file   Highest education level: Not on file  Occupational History   Not on file  Tobacco Use   Smoking status: Never   Smokeless tobacco: Never  Substance and Sexual Activity   Alcohol use: Yes    Comment: occasionally   Drug use: Never   Sexual activity: Not on file  Other Topics Concern   Not on file  Social History Narrative   Not on file   Social Determinants of Health   Financial Resource Strain: Not on file  Food Insecurity: Not on file  Transportation Needs: Not on file  Physical Activity: Not on file  Stress: Not on file  Social Connections: Not on file  Intimate Partner Violence: Not on file    FAMILY HISTORY: Hypothyroidism - Mother  Atrial fibrillation - Maternal Grandmother Diabetes type II - Paternal Grandmother  Dementia - Paternal Grandmother   ALLERGIES:  has No Known Allergies.  MEDICATIONS:  Current Outpatient Medications  Medication Sig Dispense Refill   albuterol (VENTOLIN HFA) 108 (90 Base) MCG/ACT inhaler Inhale 1 puff into the lungs as needed for wheezing or shortness of breath. 1 each 0   dexamethasone (DECADRON) 4 MG tablet Take 2 tablets by mouth once a day for 3 days after chemotherapy. Take with food. (Patient not taking: Reported on 07/01/2020) 20 tablet 0   ferrous sulfate 325 (65 FE) MG tablet Take 325 mg  by mouth daily with breakfast. (Patient not taking: Reported on 07/01/2020)     filgrastim-sndz (ZARXIO) 480 MCG/0.8ML SOSY injection INJECT 480MCG  SUBCUTANEOUSLY DAILY FOR 3  DAYS STARTING 24 HOURS  AFTER CHEMOTHERAPY  TREATMENT EVERY 14 DAYS (Patient not taking: Reported on 07/01/2020) 2.4 mL 1   fluticasone (FLOVENT HFA) 110 MCG/ACT inhaler Inhale 1 puff into the lungs as needed. 1 each 0   fluticasone (FLOVENT HFA) 44 MCG/ACT inhaler Inhale into the lungs. (Patient not taking: Reported on 07/01/2020)     levofloxacin (LEVAQUIN) 500 MG tablet Take 1 tablet (500 mg total) by mouth daily. (Patient not taking: No sig reported) 5 tablet 0   lidocaine-prilocaine (EMLA) cream Apply to affected area once (Patient not taking: Reported on 07/01/2020) 30 g 3   loratadine (CLARITIN) 10 MG tablet Take by mouth.     LORazepam (ATIVAN) 0.5 MG tablet Take 1 tablet (0.5 mg total) by mouth every 6 (six) hours as needed (Nausea or vomiting). (Patient not taking: Reported on 07/01/2020) 30 tablet 0   Norgestimate-Ethinyl Estradiol Triphasic 0.18/0.215/0.25 MG-25 MCG tab Take 1 tablet by  mouth daily.     ondansetron (ZOFRAN) 8 MG tablet Take 1 tablet (8 mg total) by mouth 2 (two) times daily as needed. Start on the third day after chemotherapy. (Patient not taking: Reported on 07/01/2020) 30 tablet 1   prochlorperazine (COMPAZINE) 10 MG tablet Take 1 tablet (10 mg total) by mouth every 6 (six) hours as needed (Nausea or vomiting). (Patient not taking: Reported on 07/01/2020) 30 tablet 1   traMADol (ULTRAM) 50 MG tablet Take 50-100 mg by mouth every 6 (six) hours as needed. (Patient not taking: Reported on 07/01/2020)     No current facility-administered medications for this visit.    REVIEW OF SYSTEMS:   .10 Point review of Systems was done is negative except as noted above.  PHYSICAL EXAMINATION: ECOG PERFORMANCE STATUS: 1 - Symptomatic but completely ambulatory  .BP 127/84 (BP Location: Left Arm, Patient Position:  Sitting)   Pulse 71   Temp 97.8 F (36.6 C) (Oral)   Resp 17   Ht 5\' 10"  (1.778 m)   Wt 146 lb 12.8 oz (66.6 kg)   SpO2 100%   BMI 21.06 kg/m    . GENERAL:alert, in no acute distress and comfortable SKIN: no acute rashes, no significant lesions EYES: conjunctiva are pink and non-injected, sclera anicteric OROPHARYNX: MMM, no exudates, no oropharyngeal erythema or ulceration NECK: supple, no JVD LYMPH:  no palpable lymphadenopathy in the cervical, axillary or inguinal regions LUNGS: clear to auscultation b/l with normal respiratory effort HEART: regular rate & rhythm ABDOMEN:  normoactive bowel sounds , non tender, not distended. Extremity: no pedal edema PSYCH: alert & oriented x 3 with fluent speech NEURO: no focal motor/sensory deficits   LABORATORY DATA:  I have reviewed the data as listed  CBC Latest Ref Rng & Units 01/09/2021 10/10/2020 06/09/2020  WBC 4.0 - 10.5 K/uL 5.2 7.0 8.5  Hemoglobin 12.0 - 15.0 g/dL 13.8 13.9 12.8  Hematocrit 36.0 - 46.0 % 40.5 39.5 36.1  Platelets 150 - 400 K/uL 286 296 309    CMP Latest Ref Rng & Units 01/09/2021 10/10/2020 06/09/2020  Glucose 70 - 99 mg/dL 86 96 99  BUN 6 - 20 mg/dL 11 10 9   Creatinine 0.44 - 1.00 mg/dL 0.86 0.90 0.72  Sodium 135 - 145 mmol/L 139 138 141  Potassium 3.5 - 5.1 mmol/L 3.7 4.2 4.0  Chloride 98 - 111 mmol/L 106 105 108  CO2 22 - 32 mmol/L 26 25 24   Calcium 8.9 - 10.3 mg/dL 9.3 9.4 9.0  Total Protein 6.5 - 8.1 g/dL 6.8 6.8 6.7  Total Bilirubin 0.3 - 1.2 mg/dL 0.5 0.7 0.4  Alkaline Phos 38 - 126 U/L 49 55 56  AST 15 - 41 U/L 17 12(L) 19  ALT 0 - 44 U/L 11 7 10     RADIOGRAPHIC STUDIES: I have personally reviewed the radiological images as listed and agreed with the findings in the report. No results found.   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 Patient completed her 6 cycles of  chemotherapy (ABVD x 2 cycles followed by AVD for 4 cycles) on 05/11/2020. -10/09/2019 Right Cervical Lymph Node Biopsy revealed "Classic Hodgkin's lymphoma, nodular sclerosis type".  -11/05/2019 PET/CT revealed  "Hypermetabolic lymphadeopathy involving multiple nodal stations in the neck and chest, consistent with reported history of lymphoma."  -01/20/2020 PET/CT ([id number]) revealed "1. No findings of pathologically enlarged or hypermetabolic adenopathy." PET Skull  Base to Thigh on 06/08/2020; completely in remission. 2) Anemia of chronic disease.- resolved 3) chemotherapy related leukopenia/neutropenia - resolved .  PLAN: -Discussed pt labwork, 01/09/2021-patient's CBC and CMP are within normal limits. -No lab or clinical evidence of Hodgkin's lymphoma recurrence or progression at this time. -Recommended she get her annual flu shot and the new COVID-19 bivalent booster vaccine -Recommended pt receive no live virus vaccines for two years post treatment. -Patient was curious about total doses of her chemotherapy meds and lifetime limits I gave her a printout with relevant information from her treatment. -Her port has been removed. -No other residual toxicities from her chemotherapy at this time. -We will plan to see her back with repeat labs in 4 months and will discuss about possibly repeating a CT scan thereafter. She was counseled to call us back if any other new questions or concerns arise in the interim.   FOLLOW UP: Return to clinic with Dr. Irene Limbo with labs in 4 months  . The total time spent in the appointment was 20 minutes and more than 50% was on counseling and direct patient cares.  All of the patient's questions were answered with apparent satisfaction. The patient knows to call the clinic with any problems, questions or concerns.    Sullivan Lone MD [place] AAHIVMS [facility] Hematology/Oncology Physician [facility]

## 2021-05-15 ENCOUNTER — Other Ambulatory Visit: Payer: BC Managed Care – PPO

## 2021-05-15 ENCOUNTER — Ambulatory Visit: Payer: BC Managed Care – PPO | Admitting: Hematology

## 2021-05-19 ENCOUNTER — Inpatient Hospital Stay: Payer: BC Managed Care – PPO | Attending: Hematology

## 2021-05-19 ENCOUNTER — Inpatient Hospital Stay: Payer: BC Managed Care – PPO | Admitting: Hematology

## 2021-05-19 ENCOUNTER — Other Ambulatory Visit: Payer: Self-pay

## 2021-05-19 DIAGNOSIS — C8118 Nodular sclerosis classical Hodgkin lymphoma, lymph nodes of multiple sites: Secondary | ICD-10-CM

## 2021-05-19 DIAGNOSIS — Z79899 Other long term (current) drug therapy: Secondary | ICD-10-CM | POA: Insufficient documentation

## 2021-05-19 LAB — TSH: TSH: 1.19 u[IU]/mL (ref 0.308–3.960)

## 2021-05-19 LAB — CBC WITH DIFFERENTIAL/PLATELET
Abs Immature Granulocytes: 0.02 10*3/uL (ref 0.00–0.07)
Basophils Absolute: 0.1 10*3/uL (ref 0.0–0.1)
Basophils Relative: 1 %
Eosinophils Absolute: 0 10*3/uL (ref 0.0–0.5)
Eosinophils Relative: 0 %
HCT: 38.7 % (ref 36.0–46.0)
Hemoglobin: 13.2 g/dL (ref 12.0–15.0)
Immature Granulocytes: 0 %
Lymphocytes Relative: 27 %
Lymphs Abs: 2.2 10*3/uL (ref 0.7–4.0)
MCH: 31.3 pg (ref 26.0–34.0)
MCHC: 34.1 g/dL (ref 30.0–36.0)
MCV: 91.7 fL (ref 80.0–100.0)
Monocytes Absolute: 0.5 10*3/uL (ref 0.1–1.0)
Monocytes Relative: 6 %
Neutro Abs: 5.4 10*3/uL (ref 1.7–7.7)
Neutrophils Relative %: 66 %
Platelets: 323 10*3/uL (ref 150–400)
RBC: 4.22 MIL/uL (ref 3.87–5.11)
RDW: 11.6 % (ref 11.5–15.5)
WBC: 8.2 10*3/uL (ref 4.0–10.5)
nRBC: 0 % (ref 0.0–0.2)

## 2021-05-19 LAB — CMP (CANCER CENTER ONLY)
ALT: 9 U/L (ref 0–44)
AST: 14 U/L — ABNORMAL LOW (ref 15–41)
Albumin: 4.3 g/dL (ref 3.5–5.0)
Alkaline Phosphatase: 50 U/L (ref 38–126)
Anion gap: 5 (ref 5–15)
BUN: 13 mg/dL (ref 6–20)
CO2: 26 mmol/L (ref 22–32)
Calcium: 9.2 mg/dL (ref 8.9–10.3)
Chloride: 106 mmol/L (ref 98–111)
Creatinine: 0.96 mg/dL (ref 0.44–1.00)
GFR, Estimated: >60 mL/min (ref >60)
Glucose, Bld: 79 mg/dL (ref 70–99)
Potassium: 4.2 mmol/L (ref 3.5–5.1)
Sodium: 137 mmol/L (ref 135–145)
Total Bilirubin: 0.6 mg/dL (ref 0.3–1.2)
Total Protein: 6.8 g/dL (ref 6.5–8.1)

## 2021-05-19 LAB — SEDIMENTATION RATE: Sed Rate: 0 mm/hr (ref 0–22)

## 2021-05-19 NOTE — Progress Notes (Signed)
? ? ?HEMATOLOGY/ONCOLOGY CLINIC NOTE ? ?Date of Service: 05/19/2021 ? ?Patient Care Team: ?Pcp, No as PCP - General ? ?CHIEF COMPLAINTS/PURPOSE OF CONSULTATION:  ?Follow-up for nodular sclerosing Hodgkin's lymphoma ? ?HISTORY OF PRESENTING ILLNESS:  ?Please see previous note for details on initial presentation ? ?INTERVAL HISTORY:  ? ?Sonia Doyle is here for continued evaluation management of her classical Hodgkin's lymphoma. ?She notes no acute new symptoms since her last clinic visit about 4 months ago. ?No fevers no chills no night sweats no unexpected weight loss.  No new lumps or bumps. 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 ?Has been able to maintain healthy diet and exercise regimen. ?Labs done today were reviewed. ?Periods have been regular with birth control pills. ? ?MEDICAL HISTORY:  ?Childhood Asthma - not requiring preventative asthma medications/inhalers at present time  ? ?SURGICAL HISTORY: ?Tonsillectomy (2003) ?Wisdom Tooth Extraction  ? ?SOCIAL HISTORY: ?Social History  ? ?Socioeconomic History  ? Marital status: Single  ?  Spouse name: Not on file  ? Number of children: Not on file  ? Years of education: Not on file  ? Highest education level: Not on file  ?Occupational History  ? Not on file  ?Tobacco Use  ? Smoking status: Never  ? Smokeless tobacco: Never  ?Substance and Sexual Activity  ? Alcohol use: Yes  ?  Comment: occasionally  ? Drug use: Never  ? Sexual activity: Not on file  ?Other Topics Concern  ? Not on file  ?Social History Narrative  ? Not on file  ? ?Social Determinants of Health  ? ?Financial Resource Strain: Not on file  ?Food Insecurity: Not on file  ?Transportation Needs: Not on file  ?Physical Activity: Not on file  ?Stress: Not on file  ?Social Connections: Not on file  ?Intimate Partner Violence: Not on file  ? ? ?FAMILY HISTORY: ?Hypothyroidism - Mother  ?Atrial fibrillation - Maternal Grandmother ?Diabetes  type II - Paternal Grandmother  ?Dementia - Paternal Grandmother  ? ?ALLERGIES:  has No Known Allergies. ? ?MEDICATIONS:  ?Current Outpatient Medications  ?Medication Sig Dispense Refill  ? albuterol (VENTOLIN HFA) 108 (90 Base) MCG/ACT inhaler Inhale 1 puff into the lungs as needed for wheezing or shortness of breath. 1 each 0  ? fluticasone (FLOVENT HFA) 110 MCG/ACT inhaler Inhale 1 puff into the lungs as needed. 1 each 0  ? loratadine (CLARITIN) 10 MG tablet Take by mouth.    ? Norgestimate-Ethinyl Estradiol Triphasic 0.18/0.215/0.25 MG-25 MCG tab Take 1 tablet by mouth daily.    ? ?No current facility-administered medications for this visit.  ? ? ?REVIEW OF SYSTEMS:   ?10 Point review of Systems was done is negative except as noted above. ? ?PHYSICAL EXAMINATION: ?ECOG PERFORMANCE STATUS: 1 - Symptomatic but completely ambulatory ?Vital signs reviewed stable ?NAD ?GENERAL:alert, in no acute distress and comfortable ?SKIN: no acute rashes, no significant lesions ?EYES: conjunctiva are pink and non-injected, sclera anicteric ?OROPHARYNX: MMM, no exudates, no oropharyngeal erythema or ulceration ?NECK: supple, no JVD ?LYMPH:  no palpable lymphadenopathy in the cervical, axillary or inguinal regions ?LUNGS: clear to auscultation b/l with normal respiratory effort ?HEART: regular rate & rhythm ?ABDOMEN:  normoactive bowel sounds , non tender, not distended. ?Extremity: no pedal edema ?PSYCH: alert & oriented x 3 with fluent speech ?NEURO: no focal motor/sensory deficits ? ? ? ?LABORATORY DATA:  ?I have reviewed the data as listed ? ?CBC Latest Ref  Rng & Units 05/19/2021 01/09/2021 10/10/2020  ?WBC 4.0 - 10.5 K/uL 8.2 5.2 7.0  ?Hemoglobin 12.0 - 15.0 g/dL 13.2 13.8 13.9  ?Hematocrit 36.0 - 46.0 % 38.7 40.5 39.5  ?Platelets 150 - 400 K/uL 323 286 296  ? ? ?CMP Latest Ref Rng & Units 05/19/2021 01/09/2021 10/10/2020  ?Glucose 70 - 99 mg/dL 79 86 96  ?BUN 6 - 20 mg/dL 13 11 10   ?Creatinine 0.44 - 1.00 mg/dL 0.96 0.86 0.90   ?Sodium 135 - 145 mmol/L 137 139 138  ?Potassium 3.5 - 5.1 mmol/L 4.2 3.7 4.2  ?Chloride 98 - 111 mmol/L 106 106 105  ?CO2 22 - 32 mmol/L 26 26 25   ?Calcium 8.9 - 10.3 mg/dL 9.2 9.3 9.4  ?Total Protein 6.5 - 8.1 g/dL 6.8 6.8 6.8  ?Total Bilirubin 0.3 - 1.2 mg/dL 0.6 0.5 0.7  ?Alkaline Phos 38 - 126 U/L 50 49 55  ?AST 15 - 41 U/L 14(L) 17 12(L)  ?ALT 0 - 44 U/L 9 11 7   ? ?Sed rate 0 ? ?TSH 1.19 ? ?RADIOGRAPHIC STUDIES: ?I have personally reviewed the radiological images as listed and agreed with the findings in the report. ?No results found. ? ? ?ASSESSMENT & PLAN:  ? ?26 yo with  ? 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 ?Patient completed her 6 cycles of chemotherapy (ABVD x 2 cycles followed by AVD for 4 cycles) on 05/11/2020. ?-10/09/2019 Right Cervical Lymph Node Biopsy revealed "Classic Hodgkin's lymphoma, nodular sclerosis type".  ?-11/05/2019 PET/CT revealed  "Hypermetabolic lymphadeopathy involving multiple nodal stations in the neck and chest, consistent with reported history of lymphoma."  ?-01/20/2020 PET/CT ([id number]) revealed "1. No findings of pathologically enlarged or hypermetabolic adenopathy." ?PET Skull Base to Thigh on 06/08/2020; completely in remission. ?PLAN: ?-Discussed patient's lab results from today 05/19/2021. ?CBC within normal limits with hemoglobin of 13.2, WBC count of 8.2k and platelets of 323k ?CMP within normal limits ?Sedimentation rate of 0 ? ?Patient has no lab or clinical evidence of Hodgkin's lymphoma recurrence/progression at this time. ?No indication for additional treatment for Hodgkin's lymphoma at this time ?Patient has been maintaining a healthy diet and exercise regimen and has been handling medical school stress very well. ? ?-Patient notes some grade 1 Raynaud's phenomenon.  She notes she had some of this before chemotherapy but it has become a little more pronounced.  She notes that she  can manage it by warming her hands and is not inclined to pursue any medications for this currently.  We discussed that this could be a vascular effect from some of her chemotherapy drugs and hopefully will continue to improve. ? ?No other significant residual toxicities from her chemotherapy at this time. ? ?We will plan to get CT chest abdomen pelvis prior to her next visit since she has completed about a year from completion of her treatment. ? ?FOLLOW UP: ?Return to clinic with Dr. Irene Limbo with labs in 4 months. ?CT chest abdomen pelvis in 14 weeks ? ?The total time spent in the appointment was 20 minutes*. ? ?All of the patient's questions were answered with apparent satisfaction. The patient knows to call the clinic with any problems, questions or concerns. ? ? ?Sullivan Lone MD MS AAHIVMS [facility] ?Hematology/Oncology Physician ?[place] ? ?.*Total Encounter Time as defined by the Centers for Medicare and Medicaid Services includes, in addition to the face-to-face time of a patient visit (documented in the note above)  non-face-to-face time: obtaining and reviewing outside history, ordering and reviewing medications, tests or procedures, care coordination (communications with other health care professionals or caregivers) and documentation in the medical record. ? ? ?

## 2021-05-26 ENCOUNTER — Encounter: Payer: Self-pay | Admitting: Hematology

## 2021-08-25 ENCOUNTER — Telehealth: Payer: Self-pay | Admitting: Hematology

## 2021-08-25 NOTE — Telephone Encounter (Signed)
Rescheduled upcoming appointment due to provider's schedule. Patient is aware of changes. 

## 2021-09-04 ENCOUNTER — Ambulatory Visit: Payer: BC Managed Care – PPO

## 2021-09-05 ENCOUNTER — Ambulatory Visit
Admission: RE | Admit: 2021-09-05 | Discharge: 2021-09-05 | Disposition: A | Discharge status: Home / Self Care | Payer: BC Managed Care – PPO | Source: Ambulatory Visit | Attending: Hematology | Admitting: Hematology

## 2021-09-05 DIAGNOSIS — C8118 Nodular sclerosis classical Hodgkin lymphoma, lymph nodes of multiple sites: Secondary | ICD-10-CM | POA: Diagnosis present

## 2021-09-05 MED ORDER — IOHEXOL 300 MG/ML  SOLN
100.0000 mL | Freq: Once | INTRAMUSCULAR | Status: AC | PRN
  Administered 2021-09-05: 100 mL via INTRAVENOUS

## 2021-09-15 ENCOUNTER — Other Ambulatory Visit: Payer: Self-pay

## 2021-09-15 DIAGNOSIS — C8118 Nodular sclerosis classical Hodgkin lymphoma, lymph nodes of multiple sites: Secondary | ICD-10-CM

## 2021-09-18 ENCOUNTER — Other Ambulatory Visit: Payer: BC Managed Care – PPO

## 2021-09-18 ENCOUNTER — Inpatient Hospital Stay (HOSPITAL_BASED_OUTPATIENT_CLINIC_OR_DEPARTMENT_OTHER): Payer: BC Managed Care – PPO | Admitting: Hematology

## 2021-09-18 ENCOUNTER — Other Ambulatory Visit: Payer: Self-pay

## 2021-09-18 ENCOUNTER — Inpatient Hospital Stay: Payer: BC Managed Care – PPO | Attending: Hematology

## 2021-09-18 ENCOUNTER — Ambulatory Visit: Payer: BC Managed Care – PPO | Admitting: Hematology

## 2021-09-18 VITALS — BP 139/78 | HR 81 | Temp 98.1°F | Resp 15 | Wt 147.5 lb

## 2021-09-18 DIAGNOSIS — Z79899 Other long term (current) drug therapy: Secondary | ICD-10-CM | POA: Diagnosis not present

## 2021-09-18 DIAGNOSIS — Z818 Family history of other mental and behavioral disorders: Secondary | ICD-10-CM | POA: Insufficient documentation

## 2021-09-18 DIAGNOSIS — C8110 Nodular sclerosis classical Hodgkin lymphoma, unspecified site: Secondary | ICD-10-CM | POA: Insufficient documentation

## 2021-09-18 DIAGNOSIS — Z8349 Family history of other endocrine, nutritional and metabolic diseases: Secondary | ICD-10-CM | POA: Diagnosis not present

## 2021-09-18 DIAGNOSIS — C8118 Nodular sclerosis classical Hodgkin lymphoma, lymph nodes of multiple sites: Secondary | ICD-10-CM

## 2021-09-18 DIAGNOSIS — Z833 Family history of diabetes mellitus: Secondary | ICD-10-CM | POA: Insufficient documentation

## 2021-09-18 DIAGNOSIS — Z8249 Family history of ischemic heart disease and other diseases of the circulatory system: Secondary | ICD-10-CM | POA: Diagnosis not present

## 2021-09-18 LAB — CMP (CANCER CENTER ONLY)
ALT: 9 U/L (ref 0–44)
AST: 16 U/L (ref 15–41)
Albumin: 4.4 g/dL (ref 3.5–5.0)
Alkaline Phosphatase: 62 U/L (ref 38–126)
Anion gap: 6 (ref 5–15)
BUN: 14 mg/dL (ref 6–20)
CO2: 28 mmol/L (ref 22–32)
Calcium: 9.4 mg/dL (ref 8.9–10.3)
Chloride: 106 mmol/L (ref 98–111)
Creatinine: 0.93 mg/dL (ref 0.44–1.00)
GFR, Estimated: >60 mL/min (ref >60)
Glucose, Bld: 77 mg/dL (ref 70–99)
Potassium: 4.2 mmol/L (ref 3.5–5.1)
Sodium: 140 mmol/L (ref 135–145)
Total Bilirubin: 0.6 mg/dL (ref 0.3–1.2)
Total Protein: 7.1 g/dL (ref 6.5–8.1)

## 2021-09-18 LAB — CBC WITH DIFFERENTIAL (CANCER CENTER ONLY)
Abs Immature Granulocytes: 0.03 10*3/uL (ref 0.00–0.07)
Basophils Absolute: 0.1 10*3/uL (ref 0.0–0.1)
Basophils Relative: 1 %
Eosinophils Absolute: 0.1 10*3/uL (ref 0.0–0.5)
Eosinophils Relative: 1 %
HCT: 40.4 % (ref 36.0–46.0)
Hemoglobin: 14 g/dL (ref 12.0–15.0)
Immature Granulocytes: 0 %
Lymphocytes Relative: 32 %
Lymphs Abs: 2.6 10*3/uL (ref 0.7–4.0)
MCH: 31.3 pg (ref 26.0–34.0)
MCHC: 34.7 g/dL (ref 30.0–36.0)
MCV: 90.2 fL (ref 80.0–100.0)
Monocytes Absolute: 0.4 10*3/uL (ref 0.1–1.0)
Monocytes Relative: 5 %
Neutro Abs: 4.7 10*3/uL (ref 1.7–7.7)
Neutrophils Relative %: 61 %
Platelet Count: 355 10*3/uL (ref 150–400)
RBC: 4.48 MIL/uL (ref 3.87–5.11)
RDW: 11.6 % (ref 11.5–15.5)
WBC Count: 7.9 10*3/uL (ref 4.0–10.5)
nRBC: 0 % (ref 0.0–0.2)

## 2021-09-18 LAB — SEDIMENTATION RATE: Sed Rate: 0 mm/hr (ref 0–22)

## 2021-09-18 NOTE — Progress Notes (Signed)
HEMATOLOGY/ONCOLOGY CLINIC NOTE  Date of Service: 09/18/2021  Patient Care Team: Default, Provider, MD as PCP - General  CHIEF COMPLAINTS/PURPOSE OF CONSULTATION:  Follow-up for nodular sclerosing Hodgkin's lymphoma  HISTORY OF PRESENTING ILLNESS:  Please see previous note for details on initial presentation  INTERVAL HISTORY:  Sonia Doyle is a 26 y.o. female here for continued evaluation management of her classical Hodgkin's lymphoma. She reports She is doing well with no new symptoms or concerns.  She notes that she will be taking a trip to [place] with her Dad soon that she is excited about.  She notes no acute new symptoms since her last clinic visit about 4 months ago.  No fever, chills, night sweats. No new lumps, bumps, or lesions/rashes. No other new or acute focal symptoms.  Labs done today were reviewed. CT CAP results and images from 09/05/2021 were reviewed with her in details.  MEDICAL HISTORY:  Childhood Asthma - not requiring preventative asthma medications/inhalers at present time   SURGICAL HISTORY: Tonsillectomy (2003) Wisdom Tooth Extraction   SOCIAL HISTORY: Social History   Socioeconomic History   Marital status: Single    Spouse name: Not on file   Number of children: Not on file   Years of education: Not on file   Highest education level: Not on file  Occupational History   Not on file  Tobacco Use   Smoking status: Never   Smokeless tobacco: Never  Substance and Sexual Activity   Alcohol use: Yes    Comment: occasionally   Drug use: Never   Sexual activity: Not on file  Other Topics Concern   Not on file  Social History Narrative   Not on file   Social Determinants of Health   Financial Resource Strain: Not on file  Food Insecurity: Not on file  Transportation Needs: Not on file  Physical Activity: Not on file  Stress: Not on file  Social Connections: Not on file  Intimate Partner Violence: Not on file    FAMILY  HISTORY: Hypothyroidism - Mother  Atrial fibrillation - Maternal Grandmother Diabetes type II - Paternal Grandmother  Dementia - Paternal Grandmother   ALLERGIES:  has No Known Allergies.  MEDICATIONS:  Current Outpatient Medications  Medication Sig Dispense Refill   albuterol (VENTOLIN HFA) 108 (90 Base) MCG/ACT inhaler Inhale 1 puff into the lungs as needed for wheezing or shortness of breath. 1 each 0   fluticasone (FLOVENT HFA) 110 MCG/ACT inhaler Inhale 1 puff into the lungs as needed. 1 each 0   loratadine (CLARITIN) 10 MG tablet Take by mouth.     Norgestimate-Ethinyl Estradiol Triphasic 0.18/0.215/0.25 MG-25 MCG tab Take 1 tablet by mouth daily.     No current facility-administered medications for this visit.    REVIEW OF SYSTEMS:   10 Point review of Systems was done is negative except as noted above.  PHYSICAL EXAMINATION: ECOG PERFORMANCE STATUS: 1 - Symptomatic but completely ambulatory .BP 139/78 (BP Location: Left Arm, Patient Position: Sitting)   Pulse 81   Temp 98.1 F (36.7 C) (Temporal)   Resp 15   Wt 147 lb 8 oz (66.9 kg)   SpO2 100%   BMI 21.16 kg/m   NAD GENERAL:alert, in no acute distress and comfortable SKIN: no acute rashes, no significant lesions EYES: conjunctiva are pink and non-injected, sclera anicteric NECK: supple, no JVD LYMPH:  no palpable lymphadenopathy in the cervical, axillary or inguinal regions LUNGS: clear to auscultation b/l with normal respiratory effort HEART: regular  rate & rhythm ABDOMEN:  normoactive bowel sounds , non tender, not distended. Extremity: no pedal edema PSYCH: alert & oriented x 3 with fluent speech NEURO: no focal motor/sensory deficits  LABORATORY DATA:  I have reviewed the data as listed     Latest Ref Rng & Units 09/18/2021    1:25 PM 05/19/2021   12:20 PM 01/09/2021    8:40 AM  CBC  WBC 4.0 - 10.5 K/uL 7.9  8.2  5.2   Hemoglobin 12.0 - 15.0 g/dL 14.0  13.2  13.8   Hematocrit 36.0 - 46.0 % 40.4  38.7   40.5   Platelets 150 - 400 K/uL 355  323  286        Latest Ref Rng & Units 09/18/2021    1:25 PM 05/19/2021   12:20 PM 01/09/2021    8:40 AM  CMP  Glucose 70 - 99 mg/dL 77  79  86   BUN 6 - 20 mg/dL '14  13  11   '$ Creatinine 0.44 - 1.00 mg/dL 0.93  0.96  0.86   Sodium 135 - 145 mmol/L 140  137  139   Potassium 3.5 - 5.1 mmol/L 4.2  4.2  3.7   Chloride 98 - 111 mmol/L 106  106  106   CO2 22 - 32 mmol/L '28  26  26   '$ Calcium 8.9 - 10.3 mg/dL 9.4  9.2  9.3   Total Protein 6.5 - 8.1 g/dL 7.1  6.8  6.8   Total Bilirubin 0.3 - 1.2 mg/dL 0.6  0.6  0.5   Alkaline Phos 38 - 126 U/L 62  50  49   AST 15 - 41 U/L '16  14  17   '$ ALT 0 - 44 U/L '9  9  11    '$ Sed rate 0  RADIOGRAPHIC STUDIES: I have personally reviewed the radiological images as listed and agreed with the findings in the report.  CT CHEST ABDOMEN PELVIS W CONTRAST  Result Date: 09/05/2021 CLINICAL DATA:  History of Hodgkin's lymphoma, follow-up. * Tracking Code: BO * EXAM: CT CHEST, ABDOMEN, AND PELVIS WITH CONTRAST TECHNIQUE: Multidetector CT imaging of the chest, abdomen and pelvis was performed following the standard protocol during bolus administration of intravenous contrast. RADIATION DOSE REDUCTION: This exam was performed according to the departmental dose-optimization program which includes automated exposure control, adjustment of the mA and/or kV according to patient size and/or use of iterative reconstruction technique. CONTRAST:  137m OMNIPAQUE IOHEXOL 300 MG/ML  SOLN COMPARISON:  Multiple priors including most recent PET-CT June 08, 2020 FINDINGS: CT CHEST FINDINGS Cardiovascular: No significant vascular findings. Normal heart size. No pericardial effusion. Mediastinum/Nodes: No supraclavicular adenopathy. No suspicious thyroid nodule. Crescentic band of soft tissue in the anterior mediastinum appears similar prior and is most consistent with thymic tissue. No pathologically enlarged mediastinal, hilar or axillary lymph nodes.  Lungs/Pleura: No suspicious pulmonary nodules or masses. No pleural effusion. No pneumothorax. No focal airspace consolidation. Musculoskeletal: No suspicious chest wall lesion. No aggressive lytic or blastic lesion of bone. CT ABDOMEN PELVIS FINDINGS Hepatobiliary: No suspicious hepatic lesion. Gallbladder is unremarkable. No biliary ductal dilation. Pancreas: No pancreatic ductal dilation or evidence of acute inflammation. Spleen: No splenomegaly or focal splenic lesion. Adrenals/Urinary Tract: Adrenal glands are unremarkable. Kidneys are normal, without renal calculi, suspicious renal lesion, or hydronephrosis. Bladder is unremarkable for degree of distension. Stomach/Bowel: Stomach is within normal limits. Appendix is not confidently identified however there is no pericecal inflammation. No evidence of  bowel wall thickening, distention, or inflammatory changes. Moderate volume of formed stool throughout the colon suggestive of constipation. Vascular/Lymphatic: Normal caliber abdominal aorta. No pathologically enlarged abdominal or pelvic lymph nodes. Reproductive: Uterus and adnexa are unremarkable in CT appearance for reproductive age female. Other: Trace pelvic free fluid is within physiologic normal limits. Musculoskeletal: No acute or significant osseous findings. IMPRESSION: 1. No pathologically enlarged lymph nodes in the chest, abdomen or pelvis. No splenomegaly. 2. Moderate volume of formed stool throughout the colon suggestive of constipation. Electronically Signed   By: Dahlia Bailiff M.D.   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 Patient completed her 6 cycles of chemotherapy (ABVD x 2 cycles followed by AVD for 4 cycles) on 05/11/2020. -10/09/2019 Right Cervical Lymph Node Biopsy revealed "Classic Hodgkin's lymphoma, nodular sclerosis type".  -11/05/2019  PET/CT revealed  "Hypermetabolic lymphadeopathy involving multiple nodal stations in the neck and chest, consistent with reported history of lymphoma."  -01/20/2020 PET/CT ([id number]) revealed "1. No findings of pathologically enlarged or hypermetabolic adenopathy." PET Skull Base to Thigh on 06/08/2020; completely in remission.  PLAN: -Discussed patient's lab results from today. CBC within normal limits with hemoglobin of 14, WBC count of 7.9k and platelets of 355k CMP within normal limits. Sedimentation rate of 0. -CT chest, abdomen, pelvis done 09/05/2021 revealed no enlarged lymph nodes. No splenomegaly. No signs of recurrence or progression. -Patient has no lab or clinical or radiographic evidence of Hodgkin's lymphoma recurrence/progression at this time. -No indication for additional treatment for Hodgkin's lymphoma at this time Patient has been maintaining a healthy diet and exercise regimen and has been handling medical school stress very well. -No other significant residual toxicities from her chemotherapy at this time.  FOLLOW UP: Return to clinic with Dr. Irene Limbo with labs in 4 months.  The total time spent in the appointment was 20 minutes*.  All of the patient's questions were answered with apparent satisfaction. The patient knows to call the clinic with any problems, questions or concerns.   Sullivan Lone MD MS AAHIVMS [facility] Hematology/Oncology Physician [facility]  .*Total Encounter Time as defined by the Centers for Medicare and Medicaid Services includes, in addition to the face-to-face time of a patient visit (documented in the note above) non-face-to-face time: obtaining and reviewing outside history, ordering and reviewing medications, tests or procedures, care coordination (communications with other health care professionals or caregivers) and documentation in the medical record.  I, Melene Muller, am acting as scribe for Dr. Sullivan Lone, MD.  .I have  reviewed the above documentation for accuracy and completeness, and I agree with the above. Brunetta Genera MD

## 2021-09-24 ENCOUNTER — Encounter: Payer: Self-pay | Admitting: Hematology

## 2022-01-19 ENCOUNTER — Ambulatory Visit: Payer: BC Managed Care – PPO | Admitting: Hematology

## 2022-01-19 ENCOUNTER — Other Ambulatory Visit: Payer: BC Managed Care – PPO

## 2022-01-22 ENCOUNTER — Ambulatory Visit: Payer: BC Managed Care – PPO | Admitting: Hematology

## 2022-01-22 ENCOUNTER — Other Ambulatory Visit: Payer: BC Managed Care – PPO

## 2022-01-29 ENCOUNTER — Ambulatory Visit: Payer: BC Managed Care – PPO | Admitting: Hematology

## 2022-01-29 ENCOUNTER — Other Ambulatory Visit: Payer: BC Managed Care – PPO

## 2022-02-06 ENCOUNTER — Other Ambulatory Visit: Payer: Self-pay | Admitting: *Deleted

## 2022-02-06 DIAGNOSIS — C8118 Nodular sclerosis classical Hodgkin lymphoma, lymph nodes of multiple sites: Secondary | ICD-10-CM

## 2022-02-07 ENCOUNTER — Inpatient Hospital Stay: Payer: BC Managed Care – PPO | Attending: Hematology

## 2022-02-07 ENCOUNTER — Other Ambulatory Visit: Payer: Self-pay

## 2022-02-07 ENCOUNTER — Inpatient Hospital Stay (HOSPITAL_BASED_OUTPATIENT_CLINIC_OR_DEPARTMENT_OTHER): Payer: BC Managed Care – PPO | Admitting: Hematology

## 2022-02-07 VITALS — BP 114/77 | HR 80 | Temp 98.0°F | Resp 17 | Ht 70.0 in | Wt 143.7 lb

## 2022-02-07 DIAGNOSIS — C8118 Nodular sclerosis classical Hodgkin lymphoma, lymph nodes of multiple sites: Secondary | ICD-10-CM

## 2022-02-07 DIAGNOSIS — Z8571 Personal history of Hodgkin lymphoma: Secondary | ICD-10-CM | POA: Diagnosis not present

## 2022-02-07 LAB — CMP (CANCER CENTER ONLY)
ALT: 10 U/L (ref 0–44)
AST: 15 U/L (ref 15–41)
Albumin: 4.7 g/dL (ref 3.5–5.0)
Alkaline Phosphatase: 54 U/L (ref 38–126)
Anion gap: 7 (ref 5–15)
BUN: 14 mg/dL (ref 6–20)
CO2: 29 mmol/L (ref 22–32)
Calcium: 9.8 mg/dL (ref 8.9–10.3)
Chloride: 103 mmol/L (ref 98–111)
Creatinine: 0.89 mg/dL (ref 0.44–1.00)
GFR, Estimated: >60 mL/min (ref >60)
Glucose, Bld: 92 mg/dL (ref 70–99)
Potassium: 3.9 mmol/L (ref 3.5–5.1)
Sodium: 139 mmol/L (ref 135–145)
Total Bilirubin: 0.8 mg/dL (ref 0.3–1.2)
Total Protein: 7 g/dL (ref 6.5–8.1)

## 2022-02-07 LAB — CBC WITH DIFFERENTIAL (CANCER CENTER ONLY)
Abs Immature Granulocytes: 0.02 10*3/uL (ref 0.00–0.07)
Basophils Absolute: 0.1 10*3/uL (ref 0.0–0.1)
Basophils Relative: 1 %
Eosinophils Absolute: 0 10*3/uL (ref 0.0–0.5)
Eosinophils Relative: 0 %
HCT: 39.4 % (ref 36.0–46.0)
Hemoglobin: 13.8 g/dL (ref 12.0–15.0)
Immature Granulocytes: 0 %
Lymphocytes Relative: 29 %
Lymphs Abs: 2.9 10*3/uL (ref 0.7–4.0)
MCH: 31.8 pg (ref 26.0–34.0)
MCHC: 35 g/dL (ref 30.0–36.0)
MCV: 90.8 fL (ref 80.0–100.0)
Monocytes Absolute: 0.5 10*3/uL (ref 0.1–1.0)
Monocytes Relative: 5 %
Neutro Abs: 6.7 10*3/uL (ref 1.7–7.7)
Neutrophils Relative %: 65 %
Platelet Count: 316 10*3/uL (ref 150–400)
RBC: 4.34 MIL/uL (ref 3.87–5.11)
RDW: 11.8 % (ref 11.5–15.5)
WBC Count: 10.2 10*3/uL (ref 4.0–10.5)
nRBC: 0 % (ref 0.0–0.2)

## 2022-02-07 LAB — SEDIMENTATION RATE: Sed Rate: 2 mm/hr (ref 0–22)

## 2022-02-07 NOTE — Progress Notes (Signed)
HEMATOLOGY/ONCOLOGY CLINIC NOTE  Date of Service: 02/07/2022  Patient Care Team: Default, Provider, MD as PCP - General  CHIEF COMPLAINTS/PURPOSE OF CONSULTATION:  Follow-up for nodular sclerosing Hodgkin's lymphoma  HISTORY OF PRESENTING ILLNESS:  Please see previous note for details on initial presentation  INTERVAL HISTORY:  Sonia Doyle is a 26 y.o. female here for continued evaluation management of her classical Hodgkin's lymphoma.   Patient was last seen by me on 09/18/2021 and was doing well overall without new medical concerns.   Patient reports she is doing well without any new medical concerns since our last visit. She notes she has lost weight, but it's not a concerns. She denies fever, chills, back pain, abdominal pain, shortness of breath, night sweats, or leg swelling.   MEDICAL HISTORY:  Childhood Asthma - not requiring preventative asthma medications/inhalers at present time   SURGICAL HISTORY: Tonsillectomy (2003) Wisdom Tooth Extraction   SOCIAL HISTORY: Social History   Socioeconomic History   Marital status: Single    Spouse name: Not on file   Number of children: Not on file   Years of education: Not on file   Highest education level: Not on file  Occupational History   Not on file  Tobacco Use   Smoking status: Never   Smokeless tobacco: Never  Substance and Sexual Activity   Alcohol use: Yes    Comment: occasionally   Drug use: Never   Sexual activity: Not on file  Other Topics Concern   Not on file  Social History Narrative   Not on file   Social Determinants of Health   Financial Resource Strain: Not on file  Food Insecurity: Not on file  Transportation Needs: Not on file  Physical Activity: Not on file  Stress: Not on file  Social Connections: Not on file  Intimate Partner Violence: Not on file    FAMILY HISTORY: Hypothyroidism - Mother  Atrial fibrillation - Maternal Grandmother Diabetes type II - Paternal Grandmother   Dementia - Paternal Grandmother   ALLERGIES:  has No Known Allergies.  MEDICATIONS:  Current Outpatient Medications  Medication Sig Dispense Refill   albuterol (VENTOLIN HFA) 108 (90 Base) MCG/ACT inhaler Inhale 1 puff into the lungs as needed for wheezing or shortness of breath. 1 each 0   fluticasone (FLOVENT HFA) 110 MCG/ACT inhaler Inhale 1 puff into the lungs as needed. 1 each 0   loratadine (CLARITIN) 10 MG tablet Take by mouth.     Norgestimate-Ethinyl Estradiol Triphasic 0.18/0.215/0.25 MG-25 MCG tab Take 1 tablet by mouth daily.     No current facility-administered medications for this visit.    REVIEW OF SYSTEMS:   10 Point review of Systems was done is negative except as noted above.  PHYSICAL EXAMINATION: ECOG PERFORMANCE STATUS: 1 - Symptomatic but completely ambulatory .BP 114/77 (BP Location: Left Arm, Patient Position: Sitting)   Pulse 80   Temp 98 F (36.7 C) (Temporal)   Resp 17   Ht '5\' 10"'$  (1.778 m)   Wt 143 lb 11.2 oz (65.2 kg)   SpO2 100%   BMI 20.62 kg/m   NAD GENERAL:alert, in no acute distress and comfortable SKIN: no acute rashes, no significant lesions EYES: conjunctiva are pink and non-injected, sclera anicteric NECK: supple, no JVD LYMPH:  no palpable lymphadenopathy in the cervical, axillary or inguinal regions LUNGS: clear to auscultation b/l with normal respiratory effort HEART: regular rate & rhythm ABDOMEN:  normoactive bowel sounds , non tender, not distended. Extremity: no pedal  edema PSYCH: alert & oriented x 3 with fluent speech NEURO: no focal motor/sensory deficits  LABORATORY DATA:  I have reviewed the data as listed .    Latest Ref Rng & Units 02/07/2022    2:43 PM 09/18/2021    1:25 PM 05/19/2021   12:20 PM  CBC  WBC 4.0 - 10.5 K/uL 10.2  7.9  8.2   Hemoglobin 12.0 - 15.0 g/dL 13.8  14.0  13.2   Hematocrit 36.0 - 46.0 % 39.4  40.4  38.7   Platelets 150 - 400 K/uL 316  355  323        Latest Ref Rng & Units 02/07/2022     2:43 PM 09/18/2021    1:25 PM 05/19/2021   12:20 PM  CMP  Glucose 70 - 99 mg/dL 92  77  79   BUN 6 - 20 mg/dL '14  14  13   '$ Creatinine 0.44 - 1.00 mg/dL 0.89  0.93  0.96   Sodium 135 - 145 mmol/L 139  140  137   Potassium 3.5 - 5.1 mmol/L 3.9  4.2  4.2   Chloride 98 - 111 mmol/L 103  106  106   CO2 22 - 32 mmol/L '29  28  26   '$ Calcium 8.9 - 10.3 mg/dL 9.8  9.4  9.2   Total Protein 6.5 - 8.1 g/dL 7.0  7.1  6.8   Total Bilirubin 0.3 - 1.2 mg/dL 0.8  0.6  0.6   Alkaline Phos 38 - 126 U/L 54  62  50   AST 15 - 41 U/L '15  16  14   '$ ALT 0 - 44 U/L '10  9  9    '$ Sed rate 0  RADIOGRAPHIC STUDIES: I have personally reviewed the radiological images as listed and agreed with the findings in the report.  No results found.   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 Patient completed her 6 cycles of chemotherapy (ABVD x 2 cycles followed by AVD for 4 cycles) on 05/11/2020. -10/09/2019 Right Cervical Lymph Node Biopsy revealed "Classic Hodgkin's lymphoma, nodular sclerosis type".  -11/05/2019 PET/CT revealed  "Hypermetabolic lymphadeopathy involving multiple nodal stations in the neck and chest, consistent with reported history of lymphoma."  -01/20/2020 PET/CT ([id number]) revealed "1. No findings of pathologically enlarged or hypermetabolic adenopathy." PET Skull Base to Thigh on 06/08/2020; completely in remission.  PLAN: -Discussed patient's lab results from today. CBC stable.  Cmp WNL Sed rate wnl @ 2 -Patient has no lab or clinical  evidence of Hodgkin's lymphoma recurrence/progression at this time.  FOLLOW UP: Return to clinic with Dr. Irene Limbo with labs in 6 months.  The total time spent in the appointment was 20 minutes* .  All of the patient's questions were answered with apparent satisfaction. The patient knows to call the clinic with any problems, questions or  concerns.   Sullivan Lone MD MS AAHIVMS [facility] Hematology/Oncology Physician [facility]  .*Total Encounter Time as defined by the Centers for Medicare and Medicaid Services includes, in addition to the face-to-face time of a patient visit (documented in the note above) non-face-to-face time: obtaining and reviewing outside history, ordering and reviewing medications, tests or procedures, care coordination (communications with other health care professionals or caregivers) and documentation in the medical record.   Zettie Cooley, am acting as a Education administrator for Sullivan Lone, MD. .I  have reviewed the above documentation for accuracy and completeness, and I agree with the above. Brunetta Genera MD

## 2022-02-13 ENCOUNTER — Encounter: Payer: Self-pay | Admitting: Hematology

## 2022-07-12 IMAGING — US IR IMAGING GUIDED PORT INSERTION
1 series · 1 of 1 positions shown · non-contrast
Comparison: none

CLINICAL DATA: Hodgkin's lymphoma and need for porta cath to begin
chemotherapy.

[Series 1: (id) · 1 of 1 slices shown]
[im 1/1]
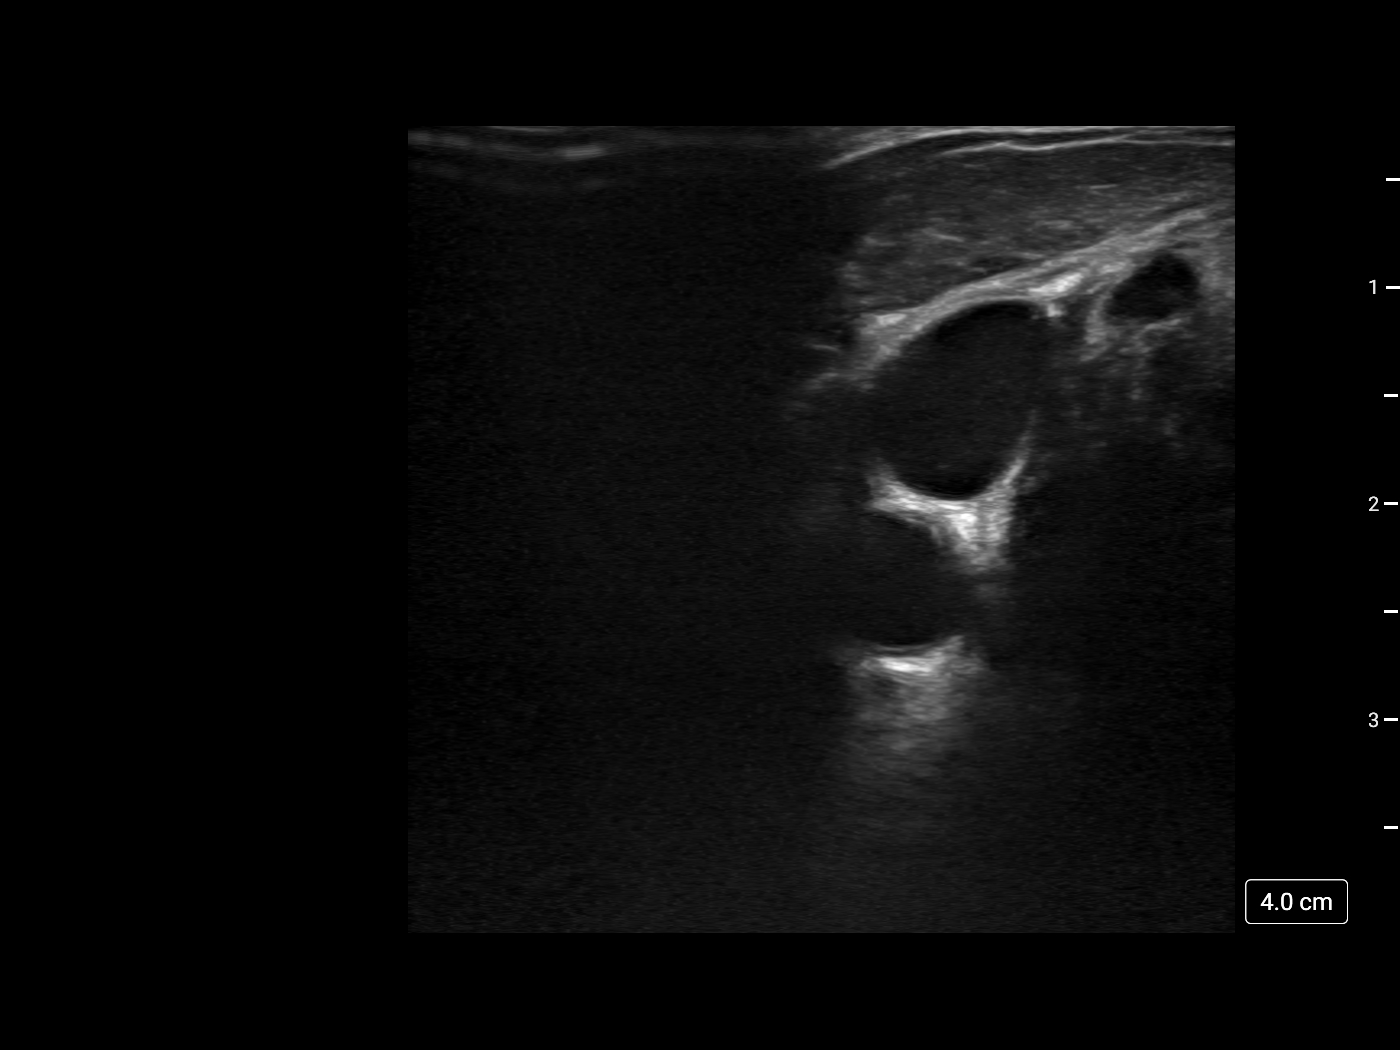

[1 of 1 positions shown; findings below may reference images not displayed]

EXAM:
IMPLANTED PORT A CATH PLACEMENT WITH ULTRASOUND AND FLUOROSCOPIC
GUIDANCE

ANESTHESIA/SEDATION:
3.0 mg IV Versed; 100 mcg IV Fentanyl

Total Moderate Sedation Time:  31 minutes

The patient's level of consciousness and physiologic status were
continuously monitored during the procedure by Radiology nursing.

Additional Medications: 2 g IV Ancef.

FLUOROSCOPY TIME:  1 minute and 12 seconds.  5.0 mGy.

PROCEDURE:
The procedure, risks, benefits, and alternatives were explained to
the patient. Questions regarding the procedure were encouraged and
answered. The patient understands and consents to the procedure. A
time-out was performed prior to initiating the procedure.

Ultrasound was utilized to confirm patency of the right internal
jugular vein. The right neck and chest were prepped with
chlorhexidine in a sterile fashion, and a sterile drape was applied
covering the operative field. Maximum barrier sterile technique with
sterile gowns and gloves were used for the procedure. Local
anesthesia was provided with 1% lidocaine.

After creating a small venotomy incision, a 21 gauge needle was
advanced into the right internal jugular vein under direct,
real-time ultrasound guidance. Ultrasound image documentation was
performed. After securing guidewire access, an 8 Fr dilator was
placed. A J-wire was kinked to measure appropriate catheter length.

A subcutaneous port pocket was then created along the upper chest
wall utilizing sharp and blunt dissection. Portable cautery was
utilized. The pocket was irrigated with sterile saline.

A single lumen power injectable port was chosen for placement. The 8
Fr catheter was tunneled from the port pocket site to the venotomy
incision. The port was placed in the pocket. External catheter was
trimmed to appropriate length based on guidewire measurement.

At the venotomy, an 8 Fr peel-away sheath was placed over a
guidewire. The catheter was then placed through the sheath and the
sheath removed. Final catheter positioning was confirmed and
documented with a fluoroscopic spot image. The port was accessed
with a needle and aspirated and flushed with heparinized saline. The
access needle was removed.

The venotomy and port pocket incisions were closed with subcutaneous
3-0 Monocryl and subcuticular 4-0 Vicryl. Dermabond was applied to
both incisions.

COMPLICATIONS:
COMPLICATIONS
None
FINDINGS: After catheter placement, the tip lies at the Nia junction.
The catheter aspirates normally and is ready for immediate use.
IMPRESSION: Placement of single lumen port a cath via right internal jugular
vein. The catheter tip lies at the Nia junction. A power
injectable port a cath was placed and is ready for immediate use.

## 2022-08-03 ENCOUNTER — Other Ambulatory Visit: Payer: Self-pay | Admitting: *Deleted

## 2022-08-03 DIAGNOSIS — C8118 Nodular sclerosis classical Hodgkin lymphoma, lymph nodes of multiple sites: Secondary | ICD-10-CM

## 2022-08-06 ENCOUNTER — Inpatient Hospital Stay: Payer: BC Managed Care – PPO | Attending: Hematology

## 2022-08-06 ENCOUNTER — Encounter: Payer: Self-pay | Admitting: Hematology

## 2022-08-06 ENCOUNTER — Inpatient Hospital Stay: Payer: BC Managed Care – PPO | Admitting: Hematology

## 2022-08-06 ENCOUNTER — Other Ambulatory Visit: Payer: Self-pay

## 2022-08-06 VITALS — BP 108/74 | HR 64 | Temp 97.9°F | Resp 16 | Ht 70.0 in | Wt 145.3 lb

## 2022-08-06 DIAGNOSIS — C8118 Nodular sclerosis classical Hodgkin lymphoma, lymph nodes of multiple sites: Secondary | ICD-10-CM

## 2022-08-06 DIAGNOSIS — Z8571 Personal history of Hodgkin lymphoma: Secondary | ICD-10-CM | POA: Diagnosis present

## 2022-08-06 LAB — CMP (CANCER CENTER ONLY)
ALT: 9 U/L (ref 0–44)
AST: 15 U/L (ref 15–41)
Albumin: 4.3 g/dL (ref 3.5–5.0)
Alkaline Phosphatase: 52 U/L (ref 38–126)
Anion gap: 6 (ref 5–15)
BUN: 12 mg/dL (ref 6–20)
CO2: 27 mmol/L (ref 22–32)
Calcium: 9 mg/dL (ref 8.9–10.3)
Chloride: 105 mmol/L (ref 98–111)
Creatinine: 0.87 mg/dL (ref 0.44–1.00)
GFR, Estimated: >60 mL/min (ref >60)
Glucose, Bld: 89 mg/dL (ref 70–99)
Potassium: 4.2 mmol/L (ref 3.5–5.1)
Sodium: 138 mmol/L (ref 135–145)
Total Bilirubin: 0.7 mg/dL (ref 0.3–1.2)
Total Protein: 6.7 g/dL (ref 6.5–8.1)

## 2022-08-06 LAB — CBC WITH DIFFERENTIAL (CANCER CENTER ONLY)
Abs Immature Granulocytes: 0.01 10*3/uL (ref 0.00–0.07)
Basophils Absolute: 0.1 10*3/uL (ref 0.0–0.1)
Basophils Relative: 1 %
Eosinophils Absolute: 0.1 10*3/uL (ref 0.0–0.5)
Eosinophils Relative: 1 %
HCT: 38.8 % (ref 36.0–46.0)
Hemoglobin: 13.5 g/dL (ref 12.0–15.0)
Immature Granulocytes: 0 %
Lymphocytes Relative: 40 %
Lymphs Abs: 2.8 10*3/uL (ref 0.7–4.0)
MCH: 31.9 pg (ref 26.0–34.0)
MCHC: 34.8 g/dL (ref 30.0–36.0)
MCV: 91.7 fL (ref 80.0–100.0)
Monocytes Absolute: 0.4 10*3/uL (ref 0.1–1.0)
Monocytes Relative: 6 %
Neutro Abs: 3.6 10*3/uL (ref 1.7–7.7)
Neutrophils Relative %: 52 %
Platelet Count: 314 10*3/uL (ref 150–400)
RBC: 4.23 MIL/uL (ref 3.87–5.11)
RDW: 11.7 % (ref 11.5–15.5)
WBC Count: 7 10*3/uL (ref 4.0–10.5)
nRBC: 0 % (ref 0.0–0.2)

## 2022-08-06 LAB — SEDIMENTATION RATE: Sed Rate: 0 mm/hr (ref 0–22)

## 2022-08-06 NOTE — Progress Notes (Signed)
HEMATOLOGY/ONCOLOGY CLINIC NOTE  Date of Service: 08/06/2022  Patient Care Team: Default, Provider, MD as PCP - General  CHIEF COMPLAINTS/PURPOSE OF CONSULTATION:  Follow-up for nodular sclerosing Hodgkin's lymphoma  HISTORY OF PRESENTING ILLNESS:  Please see previous note for details on initial presentation  INTERVAL HISTORY:  Sonia Doyle is a 27 y.o. female here for continued evaluation management of her classical Hodgkin's lymphoma.   Patient was last seen by me on 02/11/2022 and she was doing well overall.   Patient reports she has been doing well overall without any new or severe medical concerns since our last visit. She denies fever, chills, night sweats, unexpected weight loss, infection issues, new lumps/bumps, shortness of breath, chest pain, abdominal pain, skin rash, lethargy, fatigue, back pain, or leg swelling. She denies any concerns for asthma.     MEDICAL HISTORY:  Childhood Asthma - not requiring preventative asthma medications/inhalers at present time   SURGICAL HISTORY: Tonsillectomy (2003) Wisdom Tooth Extraction   SOCIAL HISTORY: Social History   Socioeconomic History   Marital status: Single    Spouse name: Not on file   Number of children: Not on file   Years of education: Not on file   Highest education level: Not on file  Occupational History   Not on file  Tobacco Use   Smoking status: Never   Smokeless tobacco: Never  Substance and Sexual Activity   Alcohol use: Yes    Comment: occasionally   Drug use: Never   Sexual activity: Not on file  Other Topics Concern   Not on file  Social History Narrative   Not on file   Social Determinants of Health   Financial Resource Strain: Not on file  Food Insecurity: Not on file  Transportation Needs: Not on file  Physical Activity: Not on file  Stress: Not on file  Social Connections: Not on file  Intimate Partner Violence: Not on file    FAMILY HISTORY: Hypothyroidism - Mother   Atrial fibrillation - Maternal Grandmother Diabetes type II - Paternal Grandmother  Dementia - Paternal Grandmother   ALLERGIES:  has No Known Allergies.  MEDICATIONS:  Current Outpatient Medications  Medication Sig Dispense Refill   albuterol (VENTOLIN HFA) 108 (90 Base) MCG/ACT inhaler Inhale 1 puff into the lungs as needed for wheezing or shortness of breath. 1 each 0   FLUoxetine (PROZAC) 20 MG capsule Take 20 mg by mouth daily.     fluticasone (FLOVENT HFA) 110 MCG/ACT inhaler Inhale 1 puff into the lungs as needed. 1 each 0   loratadine (CLARITIN) 10 MG tablet Take by mouth.     Norgestimate-Ethinyl Estradiol Triphasic 0.18/0.215/0.25 MG-25 MCG tab Take 1 tablet by mouth daily.     No current facility-administered medications for this visit.    REVIEW OF SYSTEMS:   10 Point review of Systems was done is negative except as noted above.  PHYSICAL EXAMINATION: ECOG PERFORMANCE STATUS: 1 - Symptomatic but completely ambulatory .BP 108/74 (Patient Position: Sitting)   Pulse 64   Temp 97.9 F (36.6 C) (Temporal)   Resp 16   Ht 5\' 10"  (1.778 m)   Wt 145 lb 4.8 oz (65.9 kg)   SpO2 100%   BMI 20.85 kg/m   NAD GENERAL:alert, in no acute distress and comfortable SKIN: no acute rashes, no significant lesions EYES: conjunctiva are pink and non-injected, sclera anicteric NECK: supple, no JVD LYMPH:  no palpable lymphadenopathy in the cervical, axillary or inguinal regions LUNGS: clear to auscultation b/l  with normal respiratory effort HEART: regular rate & rhythm ABDOMEN:  normoactive bowel sounds , non tender, not distended. Extremity: no pedal edema PSYCH: alert & oriented x 3 with fluent speech NEURO: no focal motor/sensory deficits  LABORATORY DATA:  I have reviewed the data as listed .    Latest Ref Rng & Units 08/06/2022    8:31 AM 02/07/2022    2:43 PM 09/18/2021    1:25 PM  CBC  WBC 4.0 - 10.5 K/uL 7.0  10.2  7.9   Hemoglobin 12.0 - 15.0 g/dL 16.1  09.6  04.5    Hematocrit 36.0 - 46.0 % 38.8  39.4  40.4   Platelets 150 - 400 K/uL 314  316  355        Latest Ref Rng & Units 08/06/2022    8:31 AM 02/07/2022    2:43 PM 09/18/2021    1:25 PM  CMP  Glucose 70 - 99 mg/dL 89  92  77   BUN 6 - 20 mg/dL 12  14  14    Creatinine 0.44 - 1.00 mg/dL 4.09  8.11  9.14   Sodium 135 - 145 mmol/L 138  139  140   Potassium 3.5 - 5.1 mmol/L 4.2  3.9  4.2   Chloride 98 - 111 mmol/L 105  103  106   CO2 22 - 32 mmol/L 27  29  28    Calcium 8.9 - 10.3 mg/dL 9.0  9.8  9.4   Total Protein 6.5 - 8.1 g/dL 6.7  7.0  7.1   Total Bilirubin 0.3 - 1.2 mg/dL 0.7  0.8  0.6   Alkaline Phos 38 - 126 U/L 52  54  62   AST 15 - 41 U/L 15  15  16    ALT 0 - 44 U/L 9  10  9     Sed rate 0  RADIOGRAPHIC STUDIES: I have personally reviewed the radiological images as listed and agreed with the findings in the report.  No results found.   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 Patient completed her 6 cycles of chemotherapy (ABVD x 2 cycles followed by AVD for 4 cycles) on 05/11/2020. -10/09/2019 Right Cervical Lymph Node Biopsy revealed "Classic Hodgkin's lymphoma, nodular sclerosis type".  -11/05/2019 PET/CT revealed  "Hypermetabolic lymphadeopathy involving multiple nodal stations in the neck and chest, consistent with reported history of lymphoma."  -01/20/2020 PET/CT ([id number]) revealed "1. No findings of pathologically enlarged or hypermetabolic adenopathy." PET Skull Base to Thigh on 06/08/2020; completely in remission.  PLAN: -Discussed the lab results from today, 08/06/2022, with the patient. CBC and CMP are stable.  -Patient has no lab or clinical  evidence of Hodgkin's lymphoma recurrence/progression at this time.  FOLLOW UP: Return to clinic with Dr. Candise Che with labs in 6 months.  The total time spent in the appointment was 15 minutes* .  All of the  patient's questions were answered with apparent satisfaction. The patient knows to call the clinic with any problems, questions or concerns.   Wyvonnia Lora MD MS AAHIVMS [facility] Hematology/Oncology Physician [facility]  .*Total Encounter Time as defined by the Centers for Medicare and Medicaid Services includes, in addition to the face-to-face time of a patient visit (documented in the note above) non-face-to-face time: obtaining and reviewing outside history, ordering and reviewing medications, tests or procedures, care coordination (communications with other health care  professionals or caregivers) and documentation in the medical record.   I, Ok Edwards, am acting as a Neurosurgeon for Wyvonnia Lora, MD.  .I have reviewed the above documentation for accuracy and completeness, and I agree with the above. Johney Maine MD

## 2022-08-07 ENCOUNTER — Telehealth: Payer: Self-pay | Admitting: Hematology

## 2022-08-12 ENCOUNTER — Encounter: Payer: Self-pay | Admitting: Hematology

## 2023-01-01 ENCOUNTER — Encounter: Payer: Self-pay | Admitting: Hematology

## 2023-01-01 NOTE — Telephone Encounter (Signed)
TC

## 2023-02-04 ENCOUNTER — Other Ambulatory Visit: Payer: BC Managed Care – PPO

## 2023-02-04 ENCOUNTER — Ambulatory Visit: Payer: BC Managed Care – PPO | Admitting: Hematology

## 2023-03-01 ENCOUNTER — Other Ambulatory Visit: Payer: Self-pay

## 2023-03-01 DIAGNOSIS — C8118 Nodular sclerosis classical Hodgkin lymphoma, lymph nodes of multiple sites: Secondary | ICD-10-CM

## 2023-03-04 ENCOUNTER — Inpatient Hospital Stay: Payer: BC Managed Care – PPO | Attending: Hematology

## 2023-03-04 ENCOUNTER — Inpatient Hospital Stay: Payer: BC Managed Care – PPO | Admitting: Hematology

## 2023-03-04 VITALS — BP 114/68 | HR 73 | Temp 97.7°F | Resp 20 | Wt 149.5 lb

## 2023-03-04 DIAGNOSIS — C8118 Nodular sclerosis classical Hodgkin lymphoma, lymph nodes of multiple sites: Secondary | ICD-10-CM | POA: Diagnosis not present

## 2023-03-04 DIAGNOSIS — Z8571 Personal history of Hodgkin lymphoma: Secondary | ICD-10-CM | POA: Insufficient documentation

## 2023-03-04 LAB — SEDIMENTATION RATE: Sed Rate: 0 mm/h (ref 0–22)

## 2023-03-04 LAB — CMP (CANCER CENTER ONLY)
ALT: 9 U/L (ref 0–44)
AST: 16 U/L (ref 15–41)
Albumin: 4.5 g/dL (ref 3.5–5.0)
Alkaline Phosphatase: 54 U/L (ref 38–126)
Anion gap: 6 (ref 5–15)
BUN: 14 mg/dL (ref 6–20)
CO2: 26 mmol/L (ref 22–32)
Calcium: 9.5 mg/dL (ref 8.9–10.3)
Chloride: 106 mmol/L (ref 98–111)
Creatinine: 0.79 mg/dL (ref 0.44–1.00)
GFR, Estimated: >60 mL/min (ref >60)
Glucose, Bld: 122 mg/dL — ABNORMAL HIGH (ref 70–99)
Potassium: 3.9 mmol/L (ref 3.5–5.1)
Sodium: 138 mmol/L (ref 135–145)
Total Bilirubin: 1.2 mg/dL — ABNORMAL HIGH (ref <1.2)
Total Protein: 6.6 g/dL (ref 6.5–8.1)

## 2023-03-04 LAB — CBC WITH DIFFERENTIAL (CANCER CENTER ONLY)
Abs Immature Granulocytes: 0.01 10*3/uL (ref 0.00–0.07)
Basophils Absolute: 0.1 10*3/uL (ref 0.0–0.1)
Basophils Relative: 1 %
Eosinophils Absolute: 0.1 10*3/uL (ref 0.0–0.5)
Eosinophils Relative: 1 %
HCT: 39.7 % (ref 36.0–46.0)
Hemoglobin: 13.9 g/dL (ref 12.0–15.0)
Immature Granulocytes: 0 %
Lymphocytes Relative: 35 %
Lymphs Abs: 2.3 10*3/uL (ref 0.7–4.0)
MCH: 31.6 pg (ref 26.0–34.0)
MCHC: 35 g/dL (ref 30.0–36.0)
MCV: 90.2 fL (ref 80.0–100.0)
Monocytes Absolute: 0.4 10*3/uL (ref 0.1–1.0)
Monocytes Relative: 5 %
Neutro Abs: 3.9 10*3/uL (ref 1.7–7.7)
Neutrophils Relative %: 58 %
Platelet Count: 350 10*3/uL (ref 150–400)
RBC: 4.4 MIL/uL (ref 3.87–5.11)
RDW: 11.5 % (ref 11.5–15.5)
WBC Count: 6.7 10*3/uL (ref 4.0–10.5)
nRBC: 0 % (ref 0.0–0.2)

## 2023-03-04 NOTE — Progress Notes (Signed)
HEMATOLOGY/ONCOLOGY CLINIC NOTE  Date of Service: 03/04/2023  Patient Care Team: Default, Provider, MD as PCP - General  CHIEF COMPLAINTS/PURPOSE OF CONSULTATION:  Follow-up for nodular sclerosing Hodgkin's lymphoma  HISTORY OF PRESENTING ILLNESS:  Please see previous note for details on initial presentation  INTERVAL HISTORY:  Sonia Doyle is a 27 y.o. female here for continued evaluation management of her classical Hodgkin's lymphoma.   Patient was last seen by me on 08/12/2022 and she was doing well overall.   Patient notes she has been doing well overall without any new or severe medical concerns since our last visit. She denies any new infection issues, fever, chills, new lumps/bumps, unexpected weight loss, night sweats, back pain, abdominal pain, chest pain, or leg swelling.   MEDICAL HISTORY:  Childhood Asthma - not requiring preventative asthma medications/inhalers at present time   SURGICAL HISTORY: Tonsillectomy (2003) Wisdom Tooth Extraction   SOCIAL HISTORY: Social History   Socioeconomic History   Marital status: Single    Spouse name: Not on file   Number of children: Not on file   Years of education: Not on file   Highest education level: Not on file  Occupational History   Not on file  Tobacco Use   Smoking status: Never   Smokeless tobacco: Never  Substance and Sexual Activity   Alcohol use: Yes    Comment: occasionally   Drug use: Never   Sexual activity: Not on file  Other Topics Concern   Not on file  Social History Narrative   Not on file   Social Drivers of Health   Financial Resource Strain: Low Risk  (10/20/2019)   Received from [facility] [place], [facility] [place]   Overall Financial Resource Strain (CARDIA)    Difficulty of Paying Living Expenses: Not hard at all  Food Insecurity: No Food Insecurity (10/20/2019)   Received from [facility] [employer] [facility], [facility] [place]   Hunger Vital Sign     Worried About Running Out of Food in the Last Year: Never true    Ran Out of Food in the Last Year: Never true  Transportation Needs: Unknown (10/20/2019)   Received from [facility] [employer], [facility] [place]   [employer] - Administrator, Civil Service (Medical): No    Lack of Transportation (Non-Medical): Not on file  Physical Activity: Not on file  Stress: Not on file  Social Connections: Unknown (05/15/2022)   Received from [facility], [facility]   Social Network    Social Network: Not on file  Intimate Partner Violence: Unknown (05/15/2022)   Received from [facility], [facility]   HITS    Physically Hurt: Not on file    Insult or Talk Down To: Not on file    Threaten Physical Harm: Not on file    Scream or Curse: Not on file    FAMILY HISTORY: Hypothyroidism - Mother  Atrial fibrillation - Maternal Grandmother Diabetes type II - Paternal Grandmother  Dementia - Paternal Grandmother   ALLERGIES:  has no known allergies.  MEDICATIONS:  Current Outpatient Medications  Medication Sig Dispense Refill   albuterol (VENTOLIN HFA) 108 (90 Base) MCG/ACT inhaler Inhale 1 puff into the lungs as needed for wheezing or shortness of breath. 1 each 0   FLUoxetine (PROZAC) 20 MG capsule Take 20 mg by mouth daily.     fluticasone (FLOVENT HFA) 110 MCG/ACT inhaler Inhale 1 puff  into the lungs as needed. 1 each 0   loratadine (CLARITIN) 10 MG tablet Take by mouth.     Norgestimate-Ethinyl Estradiol Triphasic 0.18/0.215/0.25 MG-25 MCG tab Take 1 tablet by mouth daily.     No current facility-administered medications for this visit.    REVIEW OF SYSTEMS:   10 Point review of Systems was done is negative except as noted above.  PHYSICAL EXAMINATION: ECOG PERFORMANCE STATUS: 1 - Symptomatic but completely ambulatory .BP 114/68   Pulse 73   Temp 97.7 F (36.5 C)   Resp 20   Wt 149 lb 8 oz (67.8 kg)   SpO2 100%   BMI 21.45 kg/m    NAD GENERAL:alert, in no acute distress and comfortable SKIN: no acute rashes, no significant lesions EYES: conjunctiva are pink and non-injected, sclera anicteric NECK: supple, no JVD LYMPH:  no palpable lymphadenopathy in the cervical, axillary or inguinal regions LUNGS: clear to auscultation b/l with normal respiratory effort HEART: regular rate & rhythm ABDOMEN:  normoactive bowel sounds , non tender, not distended. Extremity: no pedal edema PSYCH: alert & oriented x 3 with fluent speech NEURO: no focal motor/sensory deficits  LABORATORY DATA:  I have reviewed the data as listed .    Latest Ref Rng & Units 03/04/2023    8:11 AM 08/06/2022    8:31 AM 02/07/2022    2:43 PM  CBC  WBC 4.0 - 10.5 K/uL 6.7  7.0  10.2   Hemoglobin 12.0 - 15.0 g/dL 91.4  78.2  95.6   Hematocrit 36.0 - 46.0 % 39.7  38.8  39.4   Platelets 150 - 400 K/uL 350  314  316        Latest Ref Rng & Units 03/04/2023    8:11 AM 08/06/2022    8:31 AM 02/07/2022    2:43 PM  CMP  Glucose 70 - 99 mg/dL 213  89  92   BUN 6 - 20 mg/dL 14  12  14    Creatinine 0.44 - 1.00 mg/dL 0.86  5.78  4.69   Sodium 135 - 145 mmol/L 138  138  139   Potassium 3.5 - 5.1 mmol/L 3.9  4.2  3.9   Chloride 98 - 111 mmol/L 106  105  103   CO2 22 - 32 mmol/L 26  27  29    Calcium 8.9 - 10.3 mg/dL 9.5  9.0  9.8   Total Protein 6.5 - 8.1 g/dL 6.6  6.7  7.0   Total Bilirubin <1.2 mg/dL 1.2  0.7  0.8   Alkaline Phos 38 - 126 U/L 54  52  54   AST 15 - 41 U/L 16  15  15    ALT 0 - 44 U/L 9  9  10     Sed rate 0  RADIOGRAPHIC STUDIES: I have personally reviewed the radiological images as listed and agreed with the findings in the report.  No results found.   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 Patient completed her 6 cycles of chemotherapy (ABVD x 2 cycles followed by AVD for 4 cycles) on 05/11/2020. -10/09/2019  Right Cervical Lymph Node Biopsy revealed "Classic Hodgkin's lymphoma, nodular sclerosis type".  -11/05/2019 PET/CT revealed  "Hypermetabolic lymphadeopathy involving multiple nodal stations in the neck and chest, consistent with reported history of lymphoma."  -01/20/2020 PET/CT ([id number]) revealed "1. No findings of pathologically enlarged or hypermetabolic adenopathy." PET  Skull Base to Thigh on 06/08/2020; completely in remission.  PLAN: -Discussed lab results from today, 03/04/2023, in detail with the patient. CBC and CMP stable.   Sed rate 0 -Patient has no lab or clinical  evidence of Hodgkin's lymphoma recurrence/progression at this time. - no residual treatment related toxicities.  FOLLOW UP: Return to clinic with Dr. Candise Che with labs in 6 months.  The total time spent in the appointment was 20 minutes* .  All of the patient's questions were answered with apparent satisfaction. The patient knows to call the clinic with any problems, questions or concerns.   Wyvonnia Lora MD MS AAHIVMS [facility] Hematology/Oncology Physician [facility]  .*Total Encounter Time as defined by the Centers for Medicare and Medicaid Services includes, in addition to the face-to-face time of a patient visit (documented in the note above) non-face-to-face time: obtaining and reviewing outside history, ordering and reviewing medications, tests or procedures, care coordination (communications with other health care professionals or caregivers) and documentation in the medical record.   I,Param Shah,acting as a Neurosurgeon for Wyvonnia Lora, MD.,have documented all relevant documentation on the behalf of Wyvonnia Lora, MD,as directed by  Wyvonnia Lora, MD while in the presence of Wyvonnia Lora, MD.  .I have reviewed the above documentation for accuracy and completeness, and I agree with the above. Johney Maine MD

## 2023-03-11 ENCOUNTER — Encounter: Payer: Self-pay | Admitting: Hematology

## 2023-09-05 ENCOUNTER — Other Ambulatory Visit: Payer: Self-pay

## 2023-09-05 DIAGNOSIS — C8118 Nodular sclerosis classical Hodgkin lymphoma, lymph nodes of multiple sites: Secondary | ICD-10-CM

## 2023-09-05 NOTE — Progress Notes (Incomplete)
 HEMATOLOGY/ONCOLOGY CLINIC NOTE  Date of Service: 09/06/2023  Patient Care Team: Default, Provider, MD as PCP - General  CHIEF COMPLAINTS/PURPOSE OF CONSULTATION:  Follow-up for nodular sclerosing Hodgkin's lymphoma  HISTORY OF PRESENTING ILLNESS:  Please see previous note for details on initial presentation  INTERVAL HISTORY:  Sonia Doyle is a 28 y.o. female here for continued evaluation management of her classical Hodgkin's lymphoma.   Patient was last seen by me on 03/04/2023 and was doing well overall with no new medical complaints.  She is accompanied by her significant other during today's visit. Patient reports that she has been doing well overall since her last clinical visit.   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  Patient reports no new health concerns over the last 6 months. She denies any fever, chills, night sweats, or new lumps/bumps.   MEDICAL HISTORY:  Childhood Asthma - not requiring preventative asthma medications/inhalers at present time   SURGICAL HISTORY: Tonsillectomy (2003) Wisdom Tooth Extraction   SOCIAL HISTORY: Social History   Socioeconomic History   Marital status: Single    Spouse name: Not on file   Number of children: Not on file   Years of education: Not on file   Highest education level: Not on file  Occupational History   Not on file  Tobacco Use   Smoking status: Never   Smokeless tobacco: Never  Substance and Sexual Activity   Alcohol use: Yes    Comment: occasionally   Drug use: Never   Sexual activity: Not on file  Other Topics Concern   Not on file  Social History Narrative   Not on file   Social Drivers of Health   Financial Resource Strain: Low Risk  (10/20/2019)   Received from [facility] [place]   Overall Financial Resource Strain (CARDIA)    Difficulty of Paying Living Expenses: Not hard at all  Food Insecurity: No Food Insecurity (10/20/2019)    Received from [facility] [place]   Hunger Vital Sign    Within the past 12 months, you worried that your food would run out before you got the money to buy more.: Never true    Within the past 12 months, the food you bought just didn't last and you didn't have money to get more.: Never true  Transportation Needs: Unknown (10/20/2019)   Received from [facility] [place]   PRAPARE - Transportation    Lack of Transportation (Medical): No    Lack of Transportation (Non-Medical): Not on file  Physical Activity: Not on file  Stress: Not on file  Social Connections: Unknown (05/15/2022)   Received from [facility]   Social Network    Social Network: Not on file  Intimate Partner Violence: Unknown (05/15/2022)   Received from [facility]   HITS    Physically Hurt: Not on file    Insult or Talk Down To: Not on file    Threaten Physical Harm: Not on file    Scream or Curse: Not on file    FAMILY HISTORY: Hypothyroidism - Mother  Atrial fibrillation - Maternal Grandmother Diabetes type II - Paternal Grandmother  Dementia - Paternal Grandmother   ALLERGIES:  has no known allergies.  MEDICATIONS:  Current Outpatient Medications  Medication Sig Dispense Refill   albuterol  (VENTOLIN  HFA) 108 (90 Base) MCG/ACT inhaler Inhale 1 puff into the lungs as needed for wheezing or shortness of breath.  1 each 0   FLUoxetine (PROZAC) 20 MG capsule Take 20 mg by mouth daily.     fluticasone  (FLOVENT  HFA) 110 MCG/ACT inhaler Inhale 1 puff into the lungs as needed. 1 each 0   loratadine (CLARITIN) 10 MG tablet Take by mouth.     Norgestimate-Ethinyl Estradiol Triphasic 0.18/0.215/0.25 MG-25 MCG tab Take 1 tablet by mouth daily.     No current facility-administered medications for this visit.    REVIEW OF SYSTEMS:   10 Point review of Systems was done is negative except as noted above.  PHYSICAL EXAMINATION: ECOG PERFORMANCE STATUS: 1 - Symptomatic but completely ambulatory .There  were no vitals taken for this visit.   GENERAL:alert, in no acute distress and comfortable SKIN: no acute rashes, no significant lesions EYES: conjunctiva are pink and non-injected, sclera anicteric OROPHARYNX: MMM, no exudates, no oropharyngeal erythema or ulceration NECK: supple, no JVD LYMPH:  no palpable lymphadenopathy in the cervical, axillary or inguinal regions LUNGS: clear to auscultation b/l with normal respiratory effort HEART: regular rate & rhythm ABDOMEN:  normoactive bowel sounds , non tender, not distended. Extremity: no pedal edema PSYCH: alert & oriented x 3 with fluent speech NEURO: no focal motor/sensory deficits   LABORATORY DATA:  I have reviewed the data as listed .    Latest Ref Rng & Units 03/04/2023    8:11 AM 08/06/2022    8:31 AM 02/07/2022    2:43 PM  CBC  WBC 4.0 - 10.5 K/uL 6.7  7.0  10.2   Hemoglobin 12.0 - 15.0 g/dL 21.3  08.6  57.8   Hematocrit 36.0 - 46.0 % 39.7  38.8  39.4   Platelets 150 - 400 K/uL 350  314  316        Latest Ref Rng & Units 03/04/2023    8:11 AM 08/06/2022    8:31 AM 02/07/2022    2:43 PM  CMP  Glucose 70 - 99 mg/dL 469  89  92   BUN 6 - 20 mg/dL 14  12  14    Creatinine 0.44 - 1.00 mg/dL 6.29  5.28  4.13   Sodium 135 - 145 mmol/L 138  138  139   Potassium 3.5 - 5.1 mmol/L 3.9  4.2  3.9   Chloride 98 - 111 mmol/L 106  105  103   CO2 22 - 32 mmol/L 26  27  29    Calcium 8.9 - 10.3 mg/dL 9.5  9.0  9.8   Total Protein 6.5 - 8.1 g/dL 6.6  6.7  7.0   Total Bilirubin <1.2 mg/dL 1.2  0.7  0.8   Alkaline Phos 38 - 126 U/L 54  52  54   AST 15 - 41 U/L 16  15  15    ALT 0 - 44 U/L 9  9  10     Sed rate 0  RADIOGRAPHIC STUDIES: I have personally reviewed the radiological images as listed and agreed with the findings in the report.  No results found.   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 Patient  completed her 6 cycles of chemotherapy (ABVD x 2 cycles followed by AVD for 4 cycles) on 05/11/2020. -10/09/2019 Right Cervical Lymph Node Biopsy revealed Classic Hodgkin's lymphoma, nodular sclerosis type.  -11/05/2019 PET/CT revealed  Hypermetabolic lymphadeopathy involving multiple nodal stations in the neck and chest, consistent with reported history of lymphoma.  -01/20/2020 PET/CT (  [id number]) revealed 1. No findings of pathologically enlarged or hypermetabolic adenopathy. PET Skull Base to Thigh on 06/08/2020; completely in remission.  PLAN:  -she last received treatment in February 2022 -patient is currently at 3.5 year remission mark -Discussed lab results on 09/06/2023 in detail with patient. CBC normal, showed WBC of 7.8K, hemoglobin of 13.4, and platelets of 288K. -CMP and Sed rate pending at time of clinical visit -did not feel any enlarged lymph nodes during physically examination -there is no clinical sign or lab evidence suggestive of classical Hodgkin's lymphoma recurrence at this time -we will plan to extend visits to once a year after her next visit in 6 months -discussed that at the 5-year remission mark, the risk of recurrence drops to average population risk -answered all of patient's questions in detail  FOLLOW UP: Return to clinic with Dr. Salomon Cree with labs in 6 months  The total time spent in the appointment was *** minutes* .  All of the patient's questions were answered with apparent satisfaction. The patient knows to call the clinic with any problems, questions or concerns.   Jacquelyn Matt MD MS AAHIVMS [facility] Hematology/Oncology Physician [facility]  .*Total Encounter Time as defined by the Centers for Medicare and Medicaid Services includes, in addition to the face-to-face time of a patient visit (documented in the note above) non-face-to-face time: obtaining and reviewing outside history, ordering and reviewing medications, tests or  procedures, care coordination (communications with other health care professionals or caregivers) and documentation in the medical record.    I,Mitra Faeizi,acting as a Neurosurgeon for Jacquelyn Matt, MD.,have documented all relevant documentation on the behalf of Jacquelyn Matt, MD,as directed by  Jacquelyn Matt, MD while in the presence of Jacquelyn Matt, MD.  ***

## 2023-09-06 ENCOUNTER — Inpatient Hospital Stay: Payer: BC Managed Care – PPO | Admitting: Hematology

## 2023-09-06 ENCOUNTER — Inpatient Hospital Stay: Payer: BC Managed Care – PPO | Attending: Hematology

## 2023-09-06 VITALS — BP 121/69 | HR 69 | Temp 97.5°F | Resp 20 | Wt 151.0 lb

## 2023-09-06 DIAGNOSIS — C8111 Nodular sclerosis classical Hodgkin lymphoma, lymph nodes of head, face, and neck: Secondary | ICD-10-CM | POA: Insufficient documentation

## 2023-09-06 DIAGNOSIS — C8118 Nodular sclerosis classical Hodgkin lymphoma, lymph nodes of multiple sites: Secondary | ICD-10-CM

## 2023-09-06 LAB — CBC WITH DIFFERENTIAL (CANCER CENTER ONLY)
Abs Immature Granulocytes: 0.01 10*3/uL (ref 0.00–0.07)
Basophils Absolute: 0.1 10*3/uL (ref 0.0–0.1)
Basophils Relative: 1 %
Eosinophils Absolute: 0.1 10*3/uL (ref 0.0–0.5)
Eosinophils Relative: 1 %
HCT: 38.4 % (ref 36.0–46.0)
Hemoglobin: 13.4 g/dL (ref 12.0–15.0)
Immature Granulocytes: 0 %
Lymphocytes Relative: 34 %
Lymphs Abs: 2.6 10*3/uL (ref 0.7–4.0)
MCH: 31.3 pg (ref 26.0–34.0)
MCHC: 34.9 g/dL (ref 30.0–36.0)
MCV: 89.7 fL (ref 80.0–100.0)
Monocytes Absolute: 0.6 10*3/uL (ref 0.1–1.0)
Monocytes Relative: 7 %
Neutro Abs: 4.5 10*3/uL (ref 1.7–7.7)
Neutrophils Relative %: 57 %
Platelet Count: 288 10*3/uL (ref 150–400)
RBC: 4.28 MIL/uL (ref 3.87–5.11)
RDW: 11.7 % (ref 11.5–15.5)
WBC Count: 7.8 10*3/uL (ref 4.0–10.5)
nRBC: 0 % (ref 0.0–0.2)

## 2023-09-06 LAB — CMP (CANCER CENTER ONLY)
ALT: 11 U/L (ref 0–44)
AST: 16 U/L (ref 15–41)
Albumin: 4.7 g/dL (ref 3.5–5.0)
Alkaline Phosphatase: 59 U/L (ref 38–126)
Anion gap: 5 (ref 5–15)
BUN: 14 mg/dL (ref 6–20)
CO2: 30 mmol/L (ref 22–32)
Calcium: 9.2 mg/dL (ref 8.9–10.3)
Chloride: 104 mmol/L (ref 98–111)
Creatinine: 0.82 mg/dL (ref 0.44–1.00)
GFR, Estimated: >60 mL/min (ref >60)
Glucose, Bld: 78 mg/dL (ref 70–99)
Potassium: 4 mmol/L (ref 3.5–5.1)
Sodium: 139 mmol/L (ref 135–145)
Total Bilirubin: 1.4 mg/dL — ABNORMAL HIGH (ref 0.0–1.2)
Total Protein: 6.9 g/dL (ref 6.5–8.1)

## 2023-09-06 LAB — SEDIMENTATION RATE: Sed Rate: 0 mm/h (ref 0–22)

## 2023-09-12 ENCOUNTER — Encounter: Payer: Self-pay | Admitting: Hematology

## 2024-02-28 ENCOUNTER — Other Ambulatory Visit: Payer: Self-pay

## 2024-02-28 DIAGNOSIS — C8118 Nodular sclerosis classical Hodgkin lymphoma, lymph nodes of multiple sites: Secondary | ICD-10-CM

## 2024-03-02 ENCOUNTER — Inpatient Hospital Stay: Admitting: Hematology

## 2024-03-02 ENCOUNTER — Inpatient Hospital Stay: Attending: Hematology

## 2024-03-02 VITALS — BP 120/70 | HR 73 | Temp 97.3°F | Resp 20 | Wt 156.4 lb

## 2024-03-02 DIAGNOSIS — C8118 Nodular sclerosis classical Hodgkin lymphoma, lymph nodes of multiple sites: Secondary | ICD-10-CM

## 2024-03-02 DIAGNOSIS — C8111 Nodular sclerosis classical Hodgkin lymphoma, lymph nodes of head, face, and neck: Secondary | ICD-10-CM | POA: Diagnosis present

## 2024-03-02 LAB — CBC WITH DIFFERENTIAL (CANCER CENTER ONLY)
Abs Immature Granulocytes: 0.02 K/uL (ref 0.00–0.07)
Basophils Absolute: 0.1 K/uL (ref 0.0–0.1)
Basophils Relative: 1 %
Eosinophils Absolute: 0.1 K/uL (ref 0.0–0.5)
Eosinophils Relative: 1 %
HCT: 39.8 % (ref 36.0–46.0)
Hemoglobin: 13.9 g/dL (ref 12.0–15.0)
Immature Granulocytes: 0 %
Lymphocytes Relative: 35 %
Lymphs Abs: 2.5 K/uL (ref 0.7–4.0)
MCH: 31.4 pg (ref 26.0–34.0)
MCHC: 34.9 g/dL (ref 30.0–36.0)
MCV: 90 fL (ref 80.0–100.0)
Monocytes Absolute: 0.4 K/uL (ref 0.1–1.0)
Monocytes Relative: 6 %
Neutro Abs: 4 K/uL (ref 1.7–7.7)
Neutrophils Relative %: 57 %
Platelet Count: 346 K/uL (ref 150–400)
RBC: 4.42 MIL/uL (ref 3.87–5.11)
RDW: 11.8 % (ref 11.5–15.5)
WBC Count: 7.1 K/uL (ref 4.0–10.5)
nRBC: 0 % (ref 0.0–0.2)

## 2024-03-02 LAB — CMP (CANCER CENTER ONLY)
ALT: 9 U/L (ref 0–44)
AST: 21 U/L (ref 15–41)
Albumin: 4.8 g/dL (ref 3.5–5.0)
Alkaline Phosphatase: 69 U/L (ref 38–126)
Anion gap: 9 (ref 5–15)
BUN: 12 mg/dL (ref 6–20)
CO2: 28 mmol/L (ref 22–32)
Calcium: 9.9 mg/dL (ref 8.9–10.3)
Chloride: 103 mmol/L (ref 98–111)
Creatinine: 0.84 mg/dL (ref 0.44–1.00)
GFR, Estimated: >60 mL/min (ref >60)
Glucose, Bld: 74 mg/dL (ref 70–99)
Potassium: 4.1 mmol/L (ref 3.5–5.1)
Sodium: 140 mmol/L (ref 135–145)
Total Bilirubin: 0.8 mg/dL (ref 0.0–1.2)
Total Protein: 7.3 g/dL (ref 6.5–8.1)

## 2024-03-02 LAB — SEDIMENTATION RATE: Sed Rate: 10 mm/h (ref 0–22)

## 2024-03-02 NOTE — Progress Notes (Signed)
 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 {ELcompanions/ambulations (Optional):33762}  she was last seen by me on 09/06/2023; at the time she did not have any concerns and was doing well.  Today, she mentions that she is feeling well overall.   She is no longer taking an oral contraceptive. She is now taking Mirena as an IUD.  Denies lumps/bumps, abdominal pain, change in bowel habits, leg swelling, skin rashes, and spinal pain.  REVIEW OF SYSTEMS:   10 Point review of systems of done and is negative except as noted above.  MEDICAL HISTORY Past Medical History:  Diagnosis Date   Asthma    Hodgkin's disease (HCC)     SURGICAL HISTORY Past Surgical History:  Procedure Laterality Date   IR IMAGING GUIDED PORT INSERTION  11/19/2019   IR REMOVAL TUN ACCESS W/ PORT W/O FL MOD SED  07/01/2020    SOCIAL HISTORY Social History[1]  Social History   Social History Narrative   Not on file    SOCIAL DRIVERS OF HEALTH SDOH Screenings   Housing: Unknown (07/31/2023)   Received from [facility]  Tobacco Use: Low Risk  (02/01/2023)   Received from [facility]     FAMILY HISTORY No family history on file.   ALLERGIES: has no known  allergies.  MEDICATIONS  Current Outpatient Medications  Medication Sig Dispense Refill   albuterol  (VENTOLIN  HFA) 108 (90 Base) MCG/ACT inhaler Inhale 1 puff into the lungs as needed for wheezing or shortness of breath. 1 each 0   FLUoxetine (PROZAC) 20 MG capsule Take 20 mg by mouth daily.     fluticasone  (FLOVENT  HFA) 110 MCG/ACT inhaler Inhale 1 puff into the lungs as needed. 1 each 0   loratadine (CLARITIN) 10 MG tablet Take by mouth.     Norgestimate-Ethinyl Estradiol Triphasic 0.18/0.215/0.25 MG-25 MCG tab Take 1 tablet by mouth daily. (Patient not taking: Reported on 09/06/2023)     No current facility-administered medications for this visit.    PHYSICAL EXAMINATION: ECOG PERFORMANCE STATUS: 0 - Asymptomatic VITALS: Vitals:   03/02/24 0951  BP: 120/70  Pulse: 73  Resp: 20  Temp: (!) 97.3 F (36.3 C)  SpO2: 99%   Filed Weights   03/02/24 0951  Weight: 156 lb 6.4 oz (70.9 kg)   Body mass index is 22.44 kg/m.  GENERAL: alert, in no acute distress and comfortable SKIN: no acute rashes, no significant lesions EYES: conjunctiva are pink and non-injected, sclera anicteric OROPHARYNX: MMM, no exudates, no oropharyngeal erythema or ulceration NECK: supple, no JVD LYMPH:  no palpable lymphadenopathy in the cervical, axillary or inguinal regions LUNGS: clear to auscultation b/l with normal respiratory effort HEART: regular rate & rhythm ABDOMEN:  normoactive bowel sounds , non tender, not distended, no hepatosplenomegaly Extremity: no pedal edema PSYCH: alert &  oriented x 3 with fluent speech NEURO: no focal motor/sensory deficits  LABORATORY DATA:   I have reviewed the data as listed     Latest Ref Rng & Units 03/02/2024    9:35 AM 09/06/2023    1:40 PM 03/04/2023    8:11 AM  CBC EXTENDED  WBC 4.0 - 10.5 K/uL 7.1  7.8  6.7   RBC 3.87 - 5.11 MIL/uL 4.42  4.28  4.40   Hemoglobin 12.0 - 15.0 g/dL 86.0  86.5  86.0   HCT 36.0 - 46.0 % 39.8  38.4  39.7   Platelets  150 - 400 K/uL 346  288  350   NEUT# 1.7 - 7.7 K/uL 4.0  4.5  3.9   Lymph# 0.7 - 4.0 K/uL 2.5  2.6  2.3         Latest Ref Rng & Units 03/02/2024    9:35 AM 09/06/2023    1:40 PM 03/04/2023    8:11 AM  CMP  Glucose 70 - 99 mg/dL 74  78  877   BUN 6 - 20 mg/dL 12  14  14    Creatinine 0.44 - 1.00 mg/dL 9.15  9.17  9.20   Sodium 135 - 145 mmol/L 140  139  138   Potassium 3.5 - 5.1 mmol/L 4.1  4.0  3.9   Chloride 98 - 111 mmol/L 103  104  106   CO2 22 - 32 mmol/L 28  30  26    Calcium 8.9 - 10.3 mg/dL 9.9  9.2  9.5   Total Protein 6.5 - 8.1 g/dL 7.3  6.9  6.6   Total Bilirubin 0.0 - 1.2 mg/dL 0.8  1.4  1.2   Alkaline Phos 38 - 126 U/L 69  59  54   AST 15 - 41 U/L 21  16  16    ALT 0 - 44 U/L 9  11  9       RADIOGRAPHIC STUDIES: I have personally reviewed the radiological images as listed and agreed with the findings in the report. No results found.  ASSESSMENT & PLAN:  28 y.o. 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 Patient completed her 6 cycles of chemotherapy (ABVD x 2 cycles followed by AVD for 4 cycles) on 05/11/2020. -10/09/2019 Right Cervical Lymph Node Biopsy revealed Classic Hodgkin's lymphoma, nodular sclerosis type.  -11/05/2019 PET/CT revealed  Hypermetabolic lymphadeopathy involving multiple nodal stations in the neck and chest, consistent with reported history of lymphoma.  -01/20/2020 PET/CT ([id number]) revealed 1. No findings of pathologically enlarged or hypermetabolic adenopathy. PET Skull Base to Thigh on 06/08/2020; completely in remission.   PLAN: - Discussed lab results on 03/02/2024 in detail with patient: - CMP   - Creatinine: 0.84  - Calcium: 9.9 - CBC  - Hemoglobin: 13.9 - WBC: 7.1 - Platelets: 346  - Sed rate pending, last lab was 0  FOLLOW-UP in 12 months for labs and follow-up with Dr. Onesimo.  The total time spent in the appointment was ***  minutes* .  All of the patient's questions were answered and the patient knows to call the clinic with any problems, questions, or concerns.  Emaline Onesimo MD MS AAHIVMS [facility] Hematology/Oncology Physician [facility]  *Total Encounter Time as defined by the Centers for Medicare and Medicaid Services includes, in addition to the face-to-face time of a patient visit (documented in the note above) non-face-to-face time: obtaining and reviewing outside  history, ordering and reviewing medications, tests or procedures, care coordination (communications with other health care professionals or caregivers) and documentation in the medical record.  I, Marijo Sharps, acting as a neurosurgeon for Emaline Saran, MD.,have documented all relevant documentation on the behalf of Emaline Saran, MD,as directed by  Emaline Saran, MD while in the presence of Emaline Saran, MD.  I have reviewed the above documentation for accuracy and completeness, and I agree with the above.  Emaline Saran, MD      [1]  Social History Tobacco Use   Smoking status: Never   Smokeless tobacco: Never  Substance Use Topics   Alcohol use: Yes    Comment: occasionally   Drug use: Never

## 2024-03-08 ENCOUNTER — Encounter: Payer: Self-pay | Admitting: Hematology

## 2024-04-15 ENCOUNTER — Encounter: Payer: Self-pay | Admitting: Hematology

## 2024-04-28 IMAGING — CT CT CHEST-ABD-PELV W/ CM
2 of 4 series · 13 of 36 positions shown, 15 images · IV contrast (agent unspecified)
Comparison: Multiple priors including most recent PET-CT June 08, 2020

CLINICAL DATA: History of Hodgkin's lymphoma, follow-up. * Tracking
Code: BO *

EXAM:
CT CHEST, ABDOMEN, AND PELVIS WITH CONTRAST
TECHNIQUE: Multidetector CT imaging of the chest, abdomen and pelvis was
performed following the standard protocol during bolus
administration of intravenous contrast.

[Series 2: cap with · axial · 0.79mm/px · z∈[-1082,-522]mm · 10 of 136 slices shown, 12 images]
[im 12/136  mediastinal]
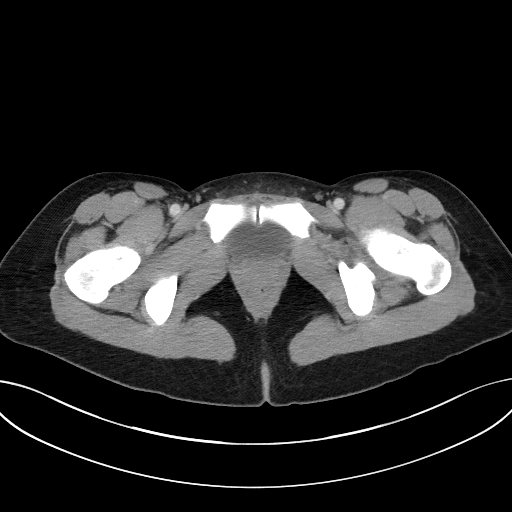
[im 12/136  bone]
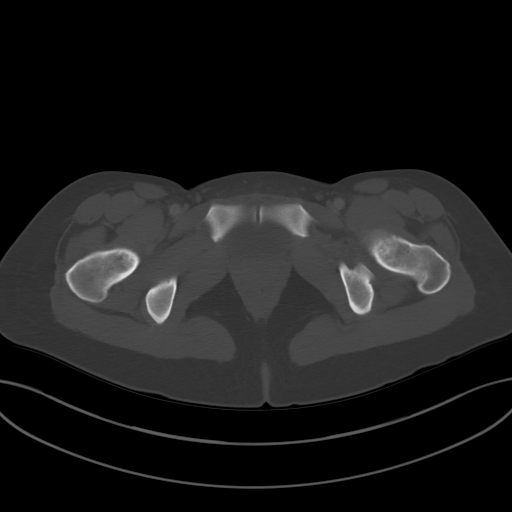
[im 23/136  mediastinal]
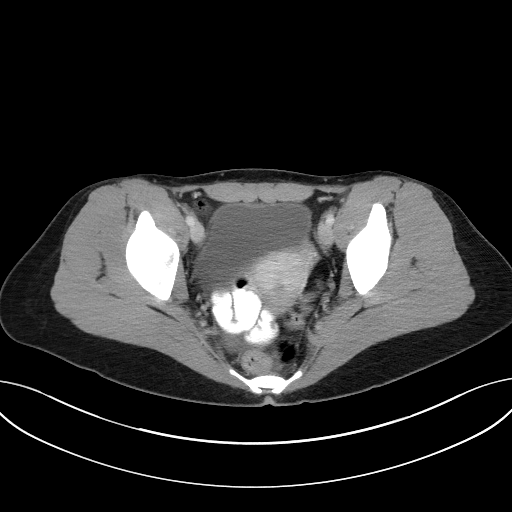
[im 34/136  mediastinal]
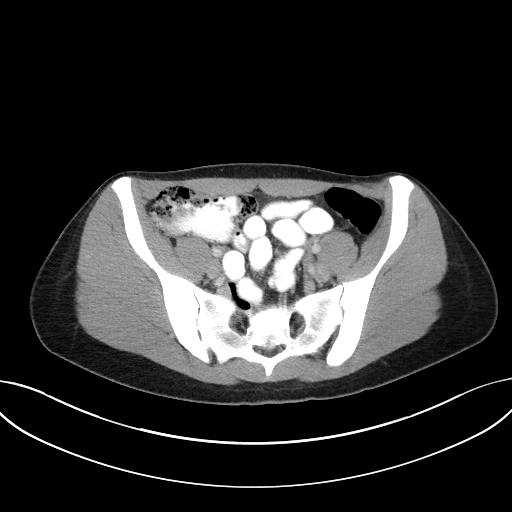
[im 46/136  mediastinal]
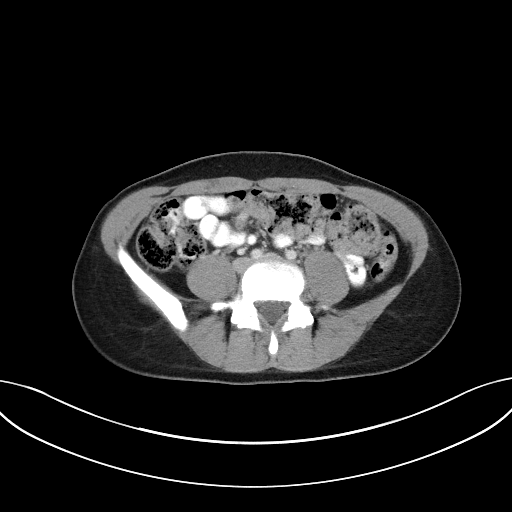
[im 57/136  mediastinal]
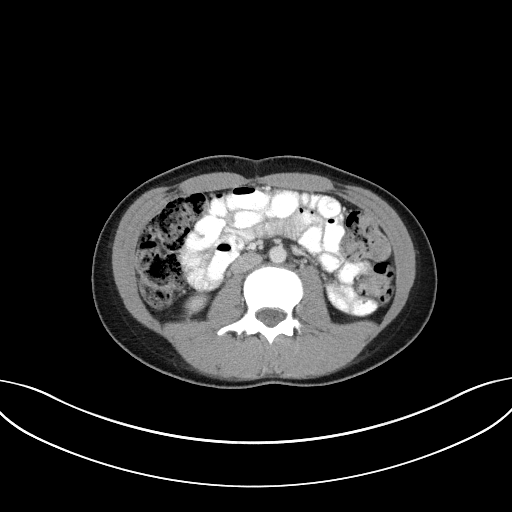
[im 79/136  mediastinal]
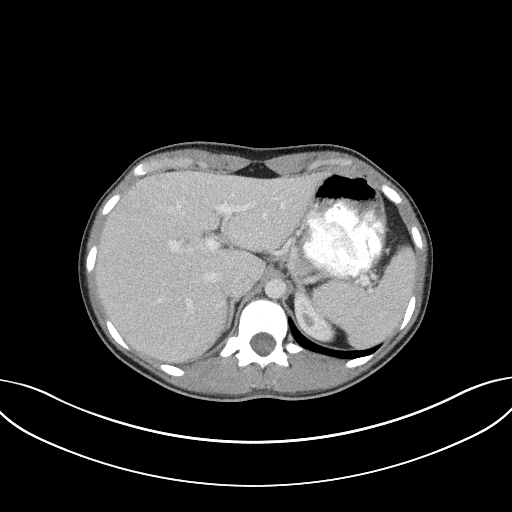
[im 91/136  mediastinal]
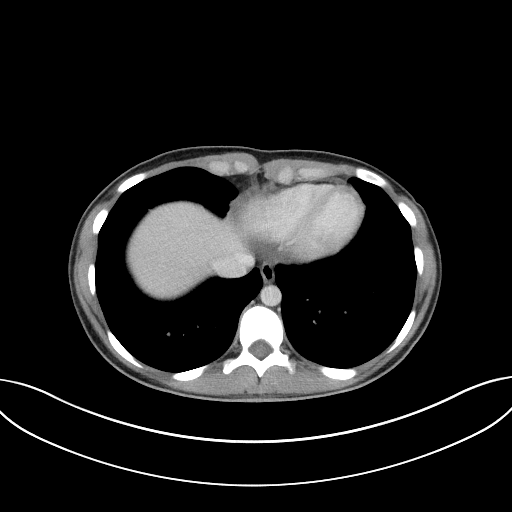
[im 102/136  mediastinal]
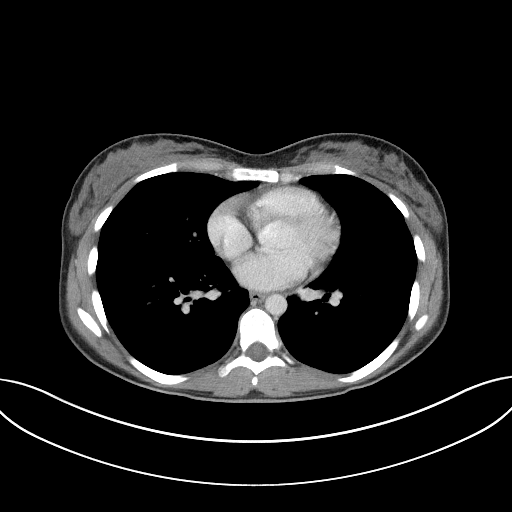
[im 113/136  mediastinal]
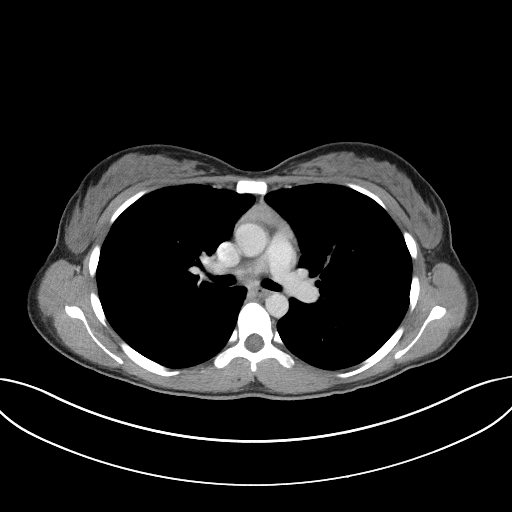
[im 113/136  bone]
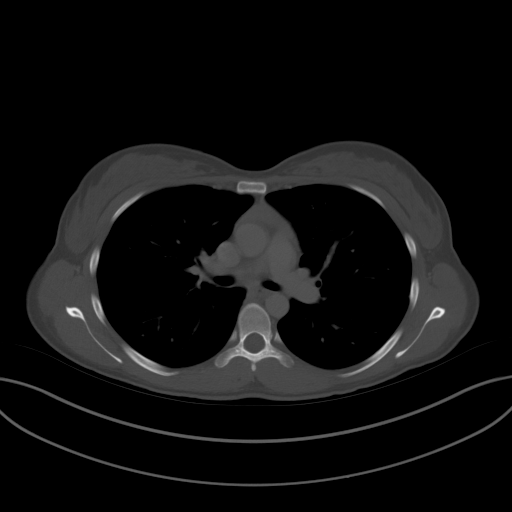
[im 124/136  mediastinal]
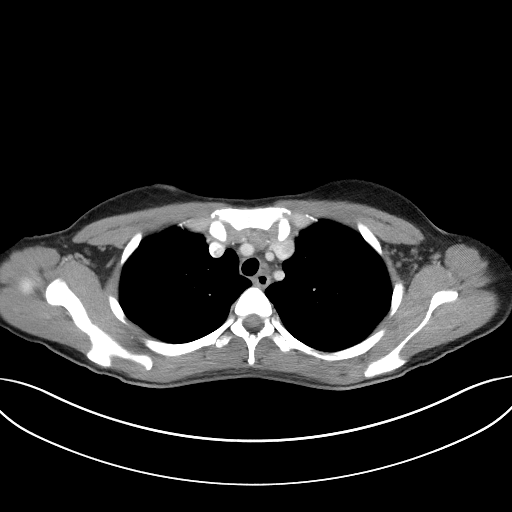

[Series 5: coronals · coronal · 0.68mm/px · 3 of 105 slices shown]
[im 21/105  mediastinal]
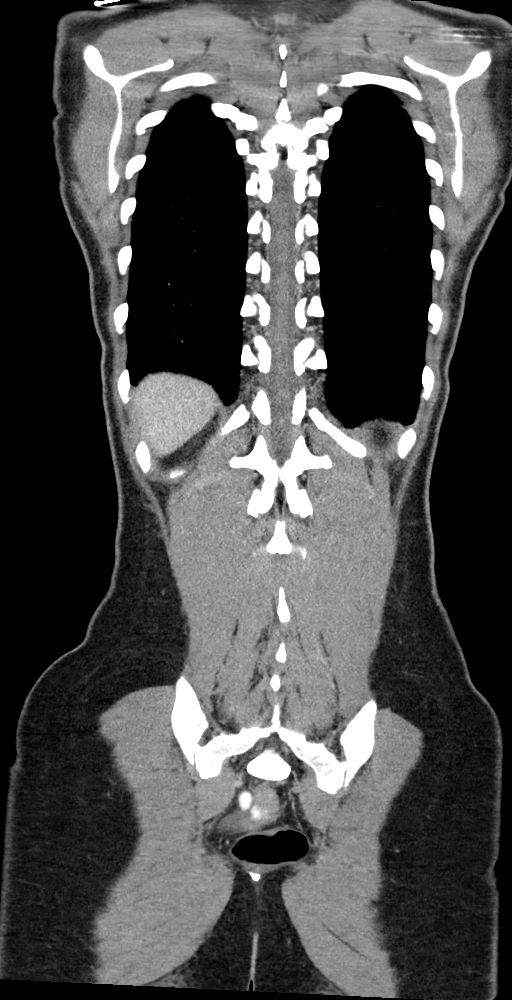
[im 42/105  mediastinal]
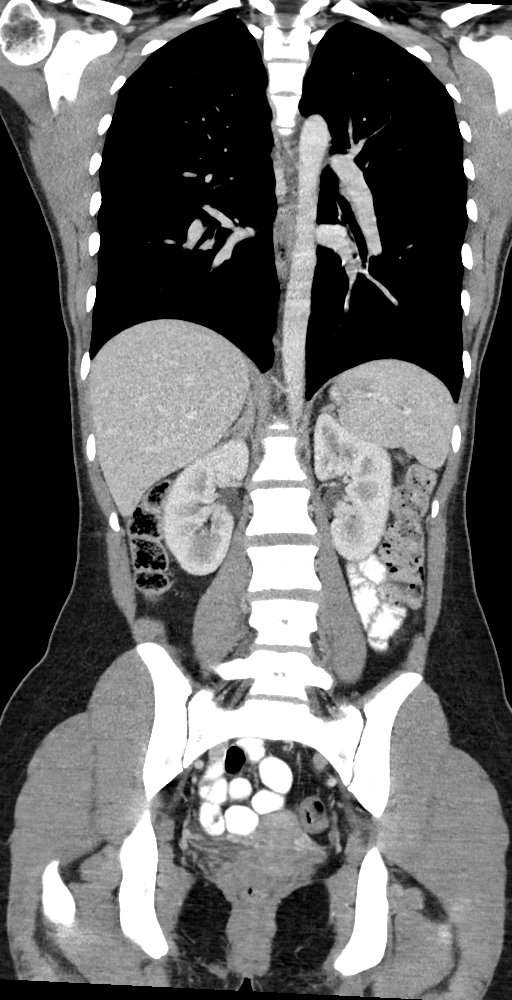
[im 63/105  mediastinal]
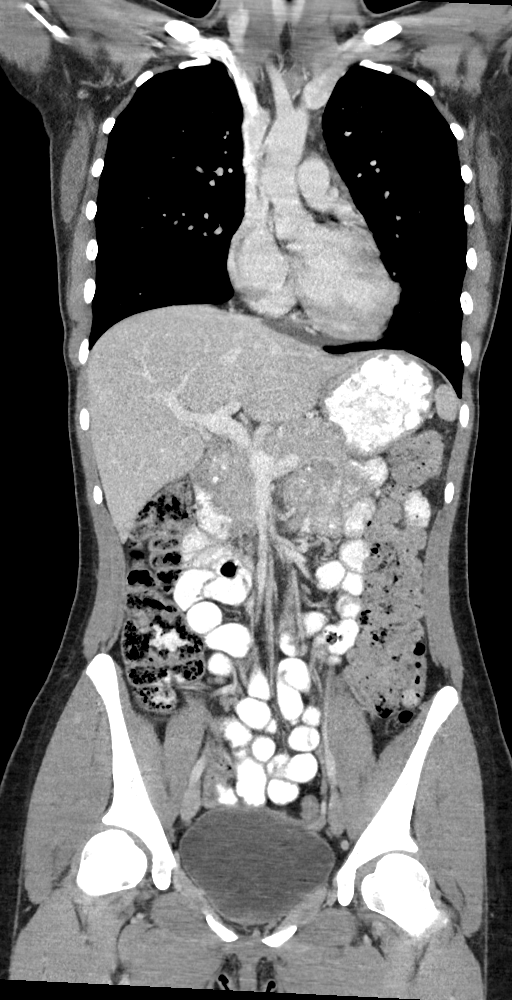

[13 of 36 positions shown; findings below may reference images not displayed]

RADIATION DOSE REDUCTION: This exam was performed according to the
departmental dose-optimization program which includes automated
exposure control, adjustment of the mA and/or kV according to
patient size and/or use of iterative reconstruction technique.

CONTRAST:  100mL OMNIPAQUE IOHEXOL 300 MG/ML  SOLN
FINDINGS: CT CHEST FINDINGS

Cardiovascular: No significant vascular findings. Normal heart size.
No pericardial effusion.

Mediastinum/Nodes: No supraclavicular adenopathy. No suspicious
thyroid nodule. Crescentic band of soft tissue in the anterior
mediastinum appears similar prior and is most consistent with thymic
tissue. No pathologically enlarged mediastinal, hilar or axillary
lymph nodes.

Lungs/Pleura: No suspicious pulmonary nodules or masses. No pleural
effusion. No pneumothorax. No focal airspace consolidation.

Musculoskeletal: No suspicious chest wall lesion. No aggressive
lytic or blastic lesion of bone.

CT ABDOMEN PELVIS FINDINGS

Hepatobiliary: No suspicious hepatic lesion. Gallbladder is
unremarkable. No biliary ductal dilation.

Pancreas: No pancreatic ductal dilation or evidence of acute
inflammation.

Spleen: No splenomegaly or focal splenic lesion.

Adrenals/Urinary Tract: Adrenal glands are unremarkable. Kidneys are
normal, without renal calculi, suspicious renal lesion, or
hydronephrosis. Bladder is unremarkable for degree of distension.

Stomach/Bowel: Stomach is within normal limits. Appendix is not
confidently identified however there is no pericecal inflammation.
No evidence of bowel wall thickening, distention, or inflammatory
changes. Moderate volume of formed stool throughout the colon
suggestive of constipation.

Vascular/Lymphatic: Normal caliber abdominal aorta. No
pathologically enlarged abdominal or pelvic lymph nodes.

Reproductive: Uterus and adnexa are unremarkable in CT appearance
for reproductive age female.

Other: Trace pelvic free fluid is within physiologic normal limits.

Musculoskeletal: No acute or significant osseous findings.
IMPRESSION: 1. No pathologically enlarged lymph nodes in the chest, abdomen or
pelvis. No splenomegaly.
2. Moderate volume of formed stool throughout the colon suggestive
of constipation.

## 2025-03-08 ENCOUNTER — Inpatient Hospital Stay: Admitting: Hematology

## 2025-03-08 ENCOUNTER — Inpatient Hospital Stay
